# Patient Record
Sex: Female | Born: 1940 | Race: White | Hispanic: No | Smoking: Never smoker
Health system: Southern US, Community
[De-identification: ages and names within clinical notes are randomized; demographics above are authoritative.]

## PROBLEM LIST (undated history)

## (undated) DIAGNOSIS — M810 Age-related osteoporosis without current pathological fracture: Secondary | ICD-10-CM

## (undated) DIAGNOSIS — C50919 Malignant neoplasm of unspecified site of unspecified female breast: Secondary | ICD-10-CM

## (undated) DIAGNOSIS — D496 Neoplasm of unspecified behavior of brain: Secondary | ICD-10-CM

## (undated) DIAGNOSIS — Z9221 Personal history of antineoplastic chemotherapy: Secondary | ICD-10-CM

## (undated) DIAGNOSIS — C50911 Malignant neoplasm of unspecified site of right female breast: Secondary | ICD-10-CM

## (undated) DIAGNOSIS — C44310 Basal cell carcinoma of skin of unspecified parts of face: Secondary | ICD-10-CM

## (undated) DIAGNOSIS — G40909 Epilepsy, unspecified, not intractable, without status epilepticus: Secondary | ICD-10-CM

## (undated) DIAGNOSIS — Z9289 Personal history of other medical treatment: Secondary | ICD-10-CM

## (undated) HISTORY — DX: Age-related osteoporosis without current pathological fracture: M81.0

## (undated) HISTORY — DX: Malignant neoplasm of unspecified site of right female breast: C50.911

## (undated) HISTORY — PX: PARTIAL HYSTERECTOMY: SHX80

## (undated) HISTORY — DX: Epilepsy, unspecified, not intractable, without status epilepticus: G40.909

## (undated) HISTORY — PX: MASTECTOMY: SHX3

## (undated) HISTORY — DX: Personal history of antineoplastic chemotherapy: Z92.21

## (undated) HISTORY — DX: Basal cell carcinoma of skin of unspecified parts of face: C44.310

## (undated) HISTORY — DX: Neoplasm of unspecified behavior of brain: D49.6

## (undated) HISTORY — DX: Personal history of other medical treatment: Z92.89

---

## 2001-08-27 ENCOUNTER — Encounter: Payer: Self-pay | Admitting: Neurosurgery

## 2001-08-27 ENCOUNTER — Ambulatory Visit (HOSPITAL_COMMUNITY): Admission: RE | Admit: 2001-08-27 | Discharge: 2001-08-27 | Payer: Self-pay | Admitting: Neurosurgery

## 2004-10-14 ENCOUNTER — Ambulatory Visit: Payer: Self-pay | Admitting: Internal Medicine

## 2004-10-22 ENCOUNTER — Ambulatory Visit: Payer: Self-pay | Admitting: Family Medicine

## 2004-10-24 DIAGNOSIS — D496 Neoplasm of unspecified behavior of brain: Secondary | ICD-10-CM

## 2004-10-24 HISTORY — DX: Neoplasm of unspecified behavior of brain: D49.6

## 2004-10-24 HISTORY — PX: OTHER SURGICAL HISTORY: SHX169

## 2005-03-22 ENCOUNTER — Ambulatory Visit: Payer: Self-pay | Admitting: Internal Medicine

## 2005-09-06 ENCOUNTER — Ambulatory Visit: Payer: Self-pay | Admitting: Internal Medicine

## 2005-10-22 ENCOUNTER — Emergency Department: Payer: Self-pay | Admitting: Emergency Medicine

## 2006-02-07 ENCOUNTER — Ambulatory Visit: Payer: Self-pay | Admitting: Family

## 2006-08-15 ENCOUNTER — Ambulatory Visit: Payer: Self-pay | Admitting: Internal Medicine

## 2006-08-17 ENCOUNTER — Ambulatory Visit: Payer: Self-pay | Admitting: Internal Medicine

## 2006-10-02 ENCOUNTER — Ambulatory Visit: Payer: Self-pay | Admitting: Internal Medicine

## 2007-10-09 ENCOUNTER — Ambulatory Visit: Payer: Self-pay | Admitting: Internal Medicine

## 2008-10-24 DIAGNOSIS — C50919 Malignant neoplasm of unspecified site of unspecified female breast: Secondary | ICD-10-CM

## 2008-10-24 HISTORY — DX: Malignant neoplasm of unspecified site of unspecified female breast: C50.919

## 2008-10-30 ENCOUNTER — Ambulatory Visit: Payer: Self-pay | Admitting: Internal Medicine

## 2008-12-30 ENCOUNTER — Ambulatory Visit: Payer: Self-pay | Admitting: Surgery

## 2009-01-05 ENCOUNTER — Ambulatory Visit: Payer: Self-pay | Admitting: Surgery

## 2009-01-05 DIAGNOSIS — C50911 Malignant neoplasm of unspecified site of right female breast: Secondary | ICD-10-CM

## 2009-01-05 HISTORY — DX: Malignant neoplasm of unspecified site of right female breast: C50.911

## 2009-01-30 ENCOUNTER — Inpatient Hospital Stay: Payer: Self-pay | Admitting: Surgery

## 2009-01-30 HISTORY — PX: MASTECTOMY WITH AXILLARY LYMPH NODE DISSECTION: SHX5661

## 2009-02-21 ENCOUNTER — Ambulatory Visit: Payer: Self-pay | Admitting: Internal Medicine

## 2009-03-02 ENCOUNTER — Ambulatory Visit: Payer: Self-pay | Admitting: Internal Medicine

## 2009-03-09 ENCOUNTER — Ambulatory Visit: Payer: Self-pay | Admitting: Surgery

## 2009-03-24 ENCOUNTER — Ambulatory Visit: Payer: Self-pay | Admitting: Internal Medicine

## 2009-04-23 ENCOUNTER — Ambulatory Visit: Payer: Self-pay | Admitting: Internal Medicine

## 2009-05-24 ENCOUNTER — Ambulatory Visit: Payer: Self-pay | Admitting: Internal Medicine

## 2009-06-24 ENCOUNTER — Ambulatory Visit: Payer: Self-pay | Admitting: Internal Medicine

## 2009-07-24 ENCOUNTER — Ambulatory Visit: Payer: Self-pay | Admitting: Internal Medicine

## 2009-08-24 ENCOUNTER — Ambulatory Visit: Payer: Self-pay | Admitting: Internal Medicine

## 2009-09-02 ENCOUNTER — Ambulatory Visit: Payer: Self-pay | Admitting: Internal Medicine

## 2009-09-15 ENCOUNTER — Ambulatory Visit: Payer: Self-pay | Admitting: Internal Medicine

## 2009-09-23 ENCOUNTER — Ambulatory Visit: Payer: Self-pay | Admitting: Internal Medicine

## 2009-10-24 ENCOUNTER — Ambulatory Visit: Payer: Self-pay | Admitting: Internal Medicine

## 2009-11-24 ENCOUNTER — Ambulatory Visit: Payer: Self-pay | Admitting: Internal Medicine

## 2009-12-22 ENCOUNTER — Ambulatory Visit: Payer: Self-pay | Admitting: Internal Medicine

## 2009-12-23 ENCOUNTER — Ambulatory Visit: Payer: Self-pay | Admitting: Internal Medicine

## 2010-01-22 ENCOUNTER — Ambulatory Visit: Payer: Self-pay | Admitting: Internal Medicine

## 2010-02-21 ENCOUNTER — Ambulatory Visit: Payer: Self-pay | Admitting: Internal Medicine

## 2010-03-24 ENCOUNTER — Ambulatory Visit: Payer: Self-pay | Admitting: Internal Medicine

## 2010-04-28 ENCOUNTER — Ambulatory Visit: Payer: Self-pay | Admitting: Internal Medicine

## 2010-04-29 LAB — CANCER ANTIGEN 27.29: CA 27.29: 9.8 U/mL (ref 0.0–38.6)

## 2010-05-24 ENCOUNTER — Ambulatory Visit: Payer: Self-pay | Admitting: Internal Medicine

## 2010-08-30 ENCOUNTER — Ambulatory Visit: Payer: Self-pay | Admitting: Internal Medicine

## 2010-09-01 LAB — CANCER ANTIGEN 27.29: CA 27.29: 12.1 U/mL (ref 0.0–38.6)

## 2010-09-23 ENCOUNTER — Ambulatory Visit: Payer: Self-pay | Admitting: Internal Medicine

## 2011-01-20 ENCOUNTER — Ambulatory Visit: Payer: Self-pay | Admitting: Internal Medicine

## 2011-01-31 ENCOUNTER — Ambulatory Visit: Payer: Self-pay | Admitting: Internal Medicine

## 2011-02-01 LAB — CANCER ANTIGEN 27.29: CA 27.29: 14.4 U/mL (ref 0.0–38.6)

## 2011-02-22 ENCOUNTER — Ambulatory Visit: Payer: Self-pay | Admitting: Internal Medicine

## 2011-02-22 DIAGNOSIS — C44310 Basal cell carcinoma of skin of unspecified parts of face: Secondary | ICD-10-CM

## 2011-02-22 HISTORY — DX: Basal cell carcinoma of skin of unspecified parts of face: C44.310

## 2011-08-02 ENCOUNTER — Ambulatory Visit: Payer: Self-pay | Admitting: Internal Medicine

## 2011-08-03 LAB — CANCER ANTIGEN 27.29: CA 27.29: 14.3 U/mL (ref 0.0–38.6)

## 2011-08-25 ENCOUNTER — Ambulatory Visit: Payer: Self-pay | Admitting: Internal Medicine

## 2012-01-26 ENCOUNTER — Ambulatory Visit: Payer: Self-pay | Admitting: Internal Medicine

## 2012-01-31 ENCOUNTER — Ambulatory Visit: Payer: Self-pay | Admitting: Internal Medicine

## 2012-01-31 LAB — CBC CANCER CENTER
Basophil %: 0.5 %
Eosinophil #: 0.3 x10 3/mm (ref 0.0–0.7)
HGB: 14.2 g/dL (ref 12.0–16.0)
Lymphocyte #: 1.8 x10 3/mm (ref 1.0–3.6)
Lymphocyte %: 28.4 %
MCH: 32.9 pg (ref 26.0–34.0)
MCHC: 34.1 g/dL (ref 32.0–36.0)
MCV: 97 fL (ref 80–100)
Monocyte #: 0.6 x10 3/mm (ref 0.0–0.7)
Monocyte %: 9.5 %
Platelet: 239 x10 3/mm (ref 150–440)
RDW: 12.9 % (ref 11.5–14.5)

## 2012-01-31 LAB — HEPATIC FUNCTION PANEL A (ARMC)
Albumin: 4.3 g/dL (ref 3.4–5.0)
Alkaline Phosphatase: 103 U/L (ref 50–136)
Bilirubin, Direct: 0.1 mg/dL (ref 0.00–0.20)
Bilirubin,Total: 0.3 mg/dL (ref 0.2–1.0)

## 2012-01-31 LAB — CREATININE, SERUM: EGFR (African American): >60

## 2012-02-01 LAB — CANCER ANTIGEN 27.29: CA 27.29: 16.8 U/mL (ref 0.0–38.6)

## 2012-02-22 ENCOUNTER — Ambulatory Visit: Payer: Self-pay | Admitting: Internal Medicine

## 2012-07-31 ENCOUNTER — Ambulatory Visit: Payer: Self-pay | Admitting: Internal Medicine

## 2012-07-31 LAB — HEPATIC FUNCTION PANEL A (ARMC)
Albumin: 4.1 g/dL (ref 3.4–5.0)
Alkaline Phosphatase: 96 U/L (ref 50–136)
Bilirubin, Direct: 0 mg/dL (ref 0.00–0.20)
Bilirubin,Total: 0.3 mg/dL (ref 0.2–1.0)
SGOT(AST): 23 U/L (ref 15–37)
SGPT (ALT): 35 U/L (ref 12–78)
Total Protein: 7.2 g/dL (ref 6.4–8.2)

## 2012-07-31 LAB — CBC CANCER CENTER
Basophil #: 0 x10 3/mm (ref 0.0–0.1)
Basophil %: 0.7 %
Eosinophil #: 0.4 x10 3/mm (ref 0.0–0.7)
Eosinophil %: 6.8 %
HCT: 43.4 % (ref 35.0–47.0)
HGB: 14.2 g/dL (ref 12.0–16.0)
Lymphocyte #: 1.6 x10 3/mm (ref 1.0–3.6)
Lymphocyte %: 27.2 %
MCH: 31.8 pg (ref 26.0–34.0)
MCHC: 32.7 g/dL (ref 32.0–36.0)
MCV: 97 fL (ref 80–100)
Monocyte #: 0.6 x10 3/mm (ref 0.2–0.9)
Monocyte %: 10 %
Neutrophil #: 3.2 x10 3/mm (ref 1.4–6.5)
Neutrophil %: 55.3 %
Platelet: 226 x10 3/mm (ref 150–440)
RBC: 4.47 10*6/uL (ref 3.80–5.20)
RDW: 12.6 % (ref 11.5–14.5)
WBC: 5.8 x10 3/mm (ref 3.6–11.0)

## 2012-07-31 LAB — CREATININE, SERUM
Creatinine: 1.01 mg/dL (ref 0.60–1.30)
EGFR (African American): >60
EGFR (Non-African Amer.): 55 — ABNORMAL LOW

## 2012-08-01 LAB — CANCER ANTIGEN 27.29: CA 27.29: 11.2 U/mL (ref 0.0–38.6)

## 2012-08-24 ENCOUNTER — Ambulatory Visit: Payer: Self-pay | Admitting: Internal Medicine

## 2012-09-23 DIAGNOSIS — M810 Age-related osteoporosis without current pathological fracture: Secondary | ICD-10-CM

## 2012-09-23 HISTORY — DX: Age-related osteoporosis without current pathological fracture: M81.0

## 2013-01-28 ENCOUNTER — Ambulatory Visit: Payer: Self-pay | Admitting: Internal Medicine

## 2013-08-02 ENCOUNTER — Ambulatory Visit: Payer: Self-pay | Admitting: Internal Medicine

## 2013-08-02 LAB — CBC CANCER CENTER
Basophil #: 0.1 x10 3/mm (ref 0.0–0.1)
Basophil %: 0.7 %
Lymphocyte #: 2.6 x10 3/mm (ref 1.0–3.6)
Lymphocyte %: 33.4 %
MCHC: 34 g/dL (ref 32.0–36.0)
Monocyte #: 0.9 x10 3/mm (ref 0.2–0.9)
Monocyte %: 12 %
Neutrophil #: 3.5 x10 3/mm (ref 1.4–6.5)
Neutrophil %: 45.1 %
RDW: 12.7 % (ref 11.5–14.5)
WBC: 7.7 x10 3/mm (ref 3.6–11.0)

## 2013-08-02 LAB — HEPATIC FUNCTION PANEL A (ARMC)
Albumin: 4 g/dL (ref 3.4–5.0)
Bilirubin, Direct: 0.1 mg/dL (ref 0.00–0.20)
SGOT(AST): 18 U/L (ref 15–37)
SGPT (ALT): 36 U/L (ref 12–78)
Total Protein: 7.2 g/dL (ref 6.4–8.2)

## 2013-08-02 LAB — CREATININE, SERUM: EGFR (Non-African Amer.): 48 — ABNORMAL LOW

## 2013-08-03 LAB — CANCER ANTIGEN 27.29: CA 27.29: 9.8 U/mL (ref 0.0–38.6)

## 2013-08-24 ENCOUNTER — Ambulatory Visit: Payer: Self-pay | Admitting: Internal Medicine

## 2014-01-29 ENCOUNTER — Ambulatory Visit: Payer: Self-pay | Admitting: Internal Medicine

## 2014-04-07 ENCOUNTER — Other Ambulatory Visit: Payer: Self-pay | Admitting: Internal Medicine

## 2014-04-07 DIAGNOSIS — G40309 Generalized idiopathic epilepsy and epileptic syndromes, not intractable, without status epilepticus: Secondary | ICD-10-CM | POA: Insufficient documentation

## 2014-04-07 DIAGNOSIS — M810 Age-related osteoporosis without current pathological fracture: Secondary | ICD-10-CM | POA: Insufficient documentation

## 2014-04-07 LAB — PHENYTOIN LEVEL, TOTAL: DILANTIN: 18.2 ug/mL (ref 10.0–20.0)

## 2014-08-11 ENCOUNTER — Ambulatory Visit: Payer: Self-pay | Admitting: Internal Medicine

## 2014-08-11 LAB — CBC CANCER CENTER
Basophil #: 0.1 x10 3/mm (ref 0.0–0.1)
Basophil %: 0.8 %
EOS ABS: 0.6 x10 3/mm (ref 0.0–0.7)
EOS PCT: 8.9 %
HCT: 45 % (ref 35.0–47.0)
HGB: 14.8 g/dL (ref 12.0–16.0)
Lymphocyte #: 1.6 x10 3/mm (ref 1.0–3.6)
Lymphocyte %: 24 %
MCH: 32 pg (ref 26.0–34.0)
MCHC: 32.8 g/dL (ref 32.0–36.0)
MCV: 97 fL (ref 80–100)
Monocyte #: 0.7 x10 3/mm (ref 0.2–0.9)
Monocyte %: 10.4 %
Neutrophil #: 3.8 x10 3/mm (ref 1.4–6.5)
Neutrophil %: 55.9 %
PLATELETS: 261 x10 3/mm (ref 150–440)
RBC: 4.62 10*6/uL (ref 3.80–5.20)
RDW: 13 % (ref 11.5–14.5)
WBC: 6.8 x10 3/mm (ref 3.6–11.0)

## 2014-08-11 LAB — CREATININE, SERUM
CREATININE: 1.03 mg/dL (ref 0.60–1.30)
EGFR (Non-African Amer.): 56 — ABNORMAL LOW

## 2014-08-11 LAB — HEPATIC FUNCTION PANEL A (ARMC)
ALT: 38 U/L
Albumin: 4.2 g/dL (ref 3.4–5.0)
Alkaline Phosphatase: 78 U/L
Bilirubin, Direct: 0.1 mg/dL (ref 0.0–0.2)
Bilirubin,Total: 0.3 mg/dL (ref 0.2–1.0)
SGOT(AST): 22 U/L (ref 15–37)
Total Protein: 7.4 g/dL (ref 6.4–8.2)

## 2014-08-12 LAB — CANCER ANTIGEN 27.29: CA 27.29: 10.5 U/mL (ref 0.0–38.6)

## 2014-08-24 ENCOUNTER — Ambulatory Visit: Payer: Self-pay | Admitting: Internal Medicine

## 2015-02-02 ENCOUNTER — Ambulatory Visit
Admit: 2015-02-02 | Disposition: A | Discharge status: Home / Self Care | Payer: Self-pay | Attending: Internal Medicine | Admitting: Internal Medicine

## 2015-02-02 DIAGNOSIS — Z9289 Personal history of other medical treatment: Secondary | ICD-10-CM

## 2015-02-02 HISTORY — DX: Personal history of other medical treatment: Z92.89

## 2015-02-20 ENCOUNTER — Inpatient Hospital Stay
Admission: EM | Admit: 2015-02-20 | Discharge: 2015-02-22 | DRG: 872 | Disposition: A | Discharge status: Skilled Nursing Facility | Payer: Medicare Other | Attending: Internal Medicine | Admitting: Internal Medicine

## 2015-02-20 DIAGNOSIS — Z87891 Personal history of nicotine dependence: Secondary | ICD-10-CM | POA: Diagnosis not present

## 2015-02-20 DIAGNOSIS — R748 Abnormal levels of other serum enzymes: Secondary | ICD-10-CM | POA: Diagnosis present

## 2015-02-20 DIAGNOSIS — N179 Acute kidney failure, unspecified: Secondary | ICD-10-CM | POA: Diagnosis present

## 2015-02-20 DIAGNOSIS — L89152 Pressure ulcer of sacral region, stage 2: Secondary | ICD-10-CM | POA: Diagnosis present

## 2015-02-20 DIAGNOSIS — Z79899 Other long term (current) drug therapy: Secondary | ICD-10-CM

## 2015-02-20 DIAGNOSIS — N390 Urinary tract infection, site not specified: Secondary | ICD-10-CM | POA: Diagnosis present

## 2015-02-20 DIAGNOSIS — A419 Sepsis, unspecified organism: Principal | ICD-10-CM | POA: Diagnosis present

## 2015-02-20 DIAGNOSIS — E860 Dehydration: Secondary | ICD-10-CM | POA: Diagnosis present

## 2015-02-20 DIAGNOSIS — M6282 Rhabdomyolysis: Secondary | ICD-10-CM | POA: Diagnosis present

## 2015-02-20 DIAGNOSIS — Z7982 Long term (current) use of aspirin: Secondary | ICD-10-CM | POA: Diagnosis not present

## 2015-02-20 DIAGNOSIS — B9620 Unspecified Escherichia coli [E. coli] as the cause of diseases classified elsewhere: Secondary | ICD-10-CM | POA: Diagnosis present

## 2015-02-20 DIAGNOSIS — L89151 Pressure ulcer of sacral region, stage 1: Secondary | ICD-10-CM

## 2015-02-20 DIAGNOSIS — I248 Other forms of acute ischemic heart disease: Secondary | ICD-10-CM | POA: Diagnosis present

## 2015-02-20 DIAGNOSIS — G40909 Epilepsy, unspecified, not intractable, without status epilepticus: Secondary | ICD-10-CM | POA: Diagnosis present

## 2015-02-20 DIAGNOSIS — R7881 Bacteremia: Secondary | ICD-10-CM

## 2015-02-20 LAB — CBC
HCT: 41.8 % (ref 35.0–47.0)
HGB: 14.3 g/dL (ref 12.0–16.0)
MCH: 32.3 pg (ref 26.0–34.0)
MCHC: 34.3 g/dL (ref 32.0–36.0)
MCV: 94 fL (ref 80–100)
Platelet: 204 10*3/uL (ref 150–440)
RBC: 4.43 10*6/uL (ref 3.80–5.20)
RDW: 12.6 % (ref 11.5–14.5)
WBC: 20.3 10*3/uL — ABNORMAL HIGH (ref 3.6–11.0)

## 2015-02-20 LAB — COMPREHENSIVE METABOLIC PANEL
ALK PHOS: 100 U/L
ALT: 114 U/L — AB
Albumin: 2.9 g/dL — ABNORMAL LOW
Anion Gap: 11 (ref 7–16)
BILIRUBIN TOTAL: 2.4 mg/dL — AB
BUN: 63 mg/dL — ABNORMAL HIGH
CALCIUM: 8.3 mg/dL — AB
CREATININE: 2.79 mg/dL — AB
Chloride: 101 mmol/L
Co2: 24 mmol/L
EGFR (African American): 18 — ABNORMAL LOW
EGFR (Non-African Amer.): 16 — ABNORMAL LOW
GLUCOSE: 161 mg/dL — AB
Potassium: 3.4 mmol/L — ABNORMAL LOW
SGOT(AST): 122 U/L — ABNORMAL HIGH
SODIUM: 136 mmol/L
Total Protein: 6.7 g/dL

## 2015-02-20 LAB — URINALYSIS, COMPLETE
Bilirubin,UR: NEGATIVE
Glucose,UR: NEGATIVE mg/dL (ref 0–75)
Ketone: NEGATIVE
Nitrite: NEGATIVE
Ph: 5 (ref 4.5–8.0)
Specific Gravity: 1.01 (ref 1.003–1.030)

## 2015-02-20 LAB — LACTIC ACID, PLASMA: Lactic Acid, Venous: 1.8 mmol/L

## 2015-02-20 LAB — TROPONIN I: TROPONIN-I: 0.26 ng/mL — AB

## 2015-02-20 LAB — CK: CK, Total: 1559 U/L — ABNORMAL HIGH

## 2015-02-21 DIAGNOSIS — A419 Sepsis, unspecified organism: Secondary | ICD-10-CM | POA: Diagnosis present

## 2015-02-21 LAB — CBC WITH DIFFERENTIAL/PLATELET
BASOS ABS: 0.1 10*3/uL (ref 0.0–0.1)
Basophil %: 0.3 %
EOS PCT: 0.9 %
Eosinophil #: 0.1 10*3/uL (ref 0.0–0.7)
HCT: 38.7 % (ref 35.0–47.0)
HGB: 13.2 g/dL (ref 12.0–16.0)
Lymphocyte #: 0.9 10*3/uL — ABNORMAL LOW (ref 1.0–3.6)
Lymphocyte %: 5.3 %
MCH: 32.4 pg (ref 26.0–34.0)
MCHC: 34.2 g/dL (ref 32.0–36.0)
MCV: 95 fL (ref 80–100)
MONO ABS: 1.5 x10 3/mm — AB (ref 0.2–0.9)
MONOS PCT: 8.9 %
Neutrophil #: 14.3 10*3/uL — ABNORMAL HIGH (ref 1.4–6.5)
Neutrophil %: 84.6 %
PLATELETS: 190 10*3/uL (ref 150–440)
RBC: 4.08 10*6/uL (ref 3.80–5.20)
RDW: 12.4 % (ref 11.5–14.5)
WBC: 16.9 10*3/uL — ABNORMAL HIGH (ref 3.6–11.0)

## 2015-02-21 LAB — COMPREHENSIVE METABOLIC PANEL
ANION GAP: 8 (ref 7–16)
AST: 121 U/L — AB
Albumin: 2.3 g/dL — ABNORMAL LOW
Alkaline Phosphatase: 102 U/L
BUN: 56 mg/dL — ABNORMAL HIGH
Bilirubin,Total: 2.1 mg/dL — ABNORMAL HIGH
CALCIUM: 7.1 mg/dL — AB
Chloride: 110 mmol/L
Co2: 19 mmol/L — ABNORMAL LOW
Creatinine: 2.56 mg/dL — ABNORMAL HIGH
EGFR (Non-African Amer.): 18 — ABNORMAL LOW
GFR CALC AF AMER: 20 — AB
Glucose: 122 mg/dL — ABNORMAL HIGH
Potassium: 2.8 mmol/L — ABNORMAL LOW
SGPT (ALT): 103 U/L — ABNORMAL HIGH
Sodium: 137 mmol/L
TOTAL PROTEIN: 5.5 g/dL — AB

## 2015-02-21 LAB — CK: CK, TOTAL: 786 U/L — AB

## 2015-02-21 MED ORDER — SODIUM CHLORIDE 0.9 % IJ SOLN: 3.0000 mL | INTRAMUSCULAR | Status: DC | PRN

## 2015-02-21 MED ORDER — SODIUM CHLORIDE 0.9 % IV SOLN
INTRAVENOUS | Status: DC
  Administered 2015-02-22: 06:00:00 via INTRAVENOUS

## 2015-02-21 MED ORDER — PNEUMOCOCCAL VAC POLYVALENT 25 MCG/0.5ML IJ INJ: 0.5000 mL | INJECTION | INTRAMUSCULAR | Status: DC | PRN

## 2015-02-21 MED ORDER — ONDANSETRON HCL 4 MG/2ML IJ SOLN: 4.0000 mg | INTRAMUSCULAR | Status: DC | PRN

## 2015-02-21 MED ORDER — PHENYTOIN SODIUM EXTENDED 100 MG PO CAPS
100.0000 mg | ORAL_CAPSULE | Freq: Three times a day (TID) | ORAL | Status: DC
  Administered 2015-02-22: 100 mg via ORAL
  Filled 2015-02-21: qty 1

## 2015-02-21 MED ORDER — ACETAMINOPHEN 325 MG PO TABS: 650.0000 mg | ORAL_TABLET | ORAL | Status: DC | PRN

## 2015-02-21 MED ORDER — HEPARIN SODIUM (PORCINE) 5000 UNIT/ML IJ SOLN
5000.0000 [IU] | Freq: Three times a day (TID) | INTRAMUSCULAR | Status: DC
  Administered 2015-02-22 (×2): 5000 [IU] via SUBCUTANEOUS
  Filled 2015-02-21 (×2): qty 1

## 2015-02-21 MED ORDER — CEFTRIAXONE SODIUM IN DEXTROSE 20 MG/ML IV SOLN
1.0000 g | INTRAVENOUS | Status: DC
  Filled 2015-02-21: qty 50

## 2015-02-21 MED ORDER — VITAMIN B-12 1000 MCG PO TABS
500.0000 ug | ORAL_TABLET | Freq: Every day | ORAL | Status: DC
  Administered 2015-02-22: 500 ug via ORAL

## 2015-02-21 MED ORDER — POTASSIUM CHLORIDE CRYS ER 10 MEQ PO TBCR
10.0000 meq | EXTENDED_RELEASE_TABLET | Freq: Two times a day (BID) | ORAL | Status: DC
  Administered 2015-02-22: 10 meq via ORAL
  Filled 2015-02-21: qty 1

## 2015-02-22 DIAGNOSIS — R7881 Bacteremia: Secondary | ICD-10-CM

## 2015-02-22 DIAGNOSIS — L89151 Pressure ulcer of sacral region, stage 1: Secondary | ICD-10-CM

## 2015-02-22 LAB — COMPREHENSIVE METABOLIC PANEL
ALT: 94 U/L — ABNORMAL HIGH (ref 14–54)
ANION GAP: 6 (ref 5–15)
AST: 98 U/L — AB (ref 15–41)
Albumin: 2 g/dL — ABNORMAL LOW (ref 3.5–5.0)
Alkaline Phosphatase: 98 U/L (ref 38–126)
BUN: 44 mg/dL — ABNORMAL HIGH (ref 6–20)
CALCIUM: 6.9 mg/dL — AB (ref 8.9–10.3)
CHLORIDE: 113 mmol/L — AB (ref 101–111)
CO2: 19 mmol/L — ABNORMAL LOW (ref 22–32)
Creatinine, Ser: 2.31 mg/dL — ABNORMAL HIGH (ref 0.44–1.00)
GFR, EST AFRICAN AMERICAN: 22 mL/min — AB (ref >60)
GFR, EST NON AFRICAN AMERICAN: 19 mL/min — AB (ref >60)
GLUCOSE: 116 mg/dL — AB (ref 65–99)
Potassium: 3.6 mmol/L (ref 3.5–5.1)
Sodium: 138 mmol/L (ref 135–145)
Total Bilirubin: 1.9 mg/dL — ABNORMAL HIGH (ref 0.3–1.2)
Total Protein: 5 g/dL — ABNORMAL LOW (ref 6.5–8.1)

## 2015-02-22 LAB — CK TOTAL AND CKMB (NOT AT ARMC)
CK TOTAL: 460 U/L — AB (ref 38–234)
CK, MB: 4.4 ng/mL (ref 0.5–5.0)
Relative Index: 1 (ref 0.0–2.5)

## 2015-02-22 LAB — CBC
HCT: 35 % (ref 35.0–47.0)
Hemoglobin: 11.8 g/dL — ABNORMAL LOW (ref 12.0–16.0)
MCH: 32 pg (ref 26.0–34.0)
MCHC: 33.6 g/dL (ref 32.0–36.0)
MCV: 95.3 fL (ref 80.0–100.0)
PLATELETS: 199 10*3/uL (ref 150–440)
RBC: 3.68 MIL/uL — AB (ref 3.80–5.20)
RDW: 12.9 % (ref 11.5–14.5)
WBC: 15.3 10*3/uL — AB (ref 3.6–11.0)

## 2015-02-22 LAB — CULTURE, BLOOD (SINGLE)

## 2015-02-22 MED ORDER — AMOXICILLIN-POT CLAVULANATE 875-125 MG PO TABS: 1.0000 | ORAL_TABLET | Freq: Two times a day (BID) | ORAL | Status: DC

## 2015-02-22 NOTE — Discharge Instructions (Signed)
Please follow up with your PCP in one week. Repeat CBC and BMP in one week at that time Check Sacral Decub daily and keep dressing dry. Apply sacral foam dressing.  Change every 3 days and as needed.

## 2015-02-22 NOTE — Discharge Summary (Signed)
[name], is a 74 y.o. female  DOB [birth year]  MRN [id number].  Admission date:  02/20/2015  Admitting Physician  Henreitta Leber, MD  Discharge Date:  02/22/2015   Primary MD  No primary care provider on file.    Admission Diagnosis  SEPSIS UTI   Discharge Diagnosis  SEPSIS UTI  Active Problems:   Sepsis   E coli bacteremia   Decubitus ulcer of sacral region, stage 1      No past medical history on file.  No past surgical history on file.     History of present illness and  Hospital Course:     Kindly see H&P for history of present illness and admission details, please review complete Labs, Consult reports and Test reports for all details in brief  HPI  from the history and physical done on the day of admission    Hospital Course   1. E. Coli bacteremia: patient was placed on Rocephin as originally on admission she presented with UTI. Her blood cultures were positive for E.col. She was afebrile and tolerated Rocephin. She was changed to oral Augment at discharge.Her bacteremia is from her UTI.  2. Sacral decubitus stage 1: She need to continue with wound care as written on discharge instructions. 3. Hx of seizure disorder: She will cont with dilantin.    Discharge Condition: stable   Follow UP  Follow-up Information    Follow up with your PCP. Schedule an appointment as soon as possible for a visit in 1 week.        Discharge Instructions  and  Discharge Medications     Discharge Instructions    Diet - low sodium heart healthy    Complete by:  As directed      Discharge instructions    Complete by:  As directed   Follow up with PCP and CBC and BMP in 1 week     Increase activity slowly    Complete by:  As directed             Medication List    TAKE these medications         amoxicillin-clavulanate 875-125 MG per tablet  Commonly known as:  AUGMENTIN  Take 1 tablet by mouth 2 (two) times daily.     aspirin 81 MG tablet  Take 81 mg by mouth daily. Aspirin 81 mg oral     phenytoin 100 MG ER capsule  Commonly known as:  DILANTIN  Take 100 mg by mouth 3 (three) times daily.     VITAMIN B-12 PO  Take by mouth daily.          Diet and Activity recommendation: See Discharge Instructions above   Consults obtained -none    Micro Results     Recent Results (from the past 240 hour(s))  Urine culture     Status: None (Preliminary result)   Collection Time: 02/20/15  4:57 PM  Result Value Ref Range Status   Micro Text Report  Preliminary       SOURCE: CLEAN CATCH    ORGANISM 1                >100,000 CFU/ML ESCHERICHIA COLI   ORGANISM 2                40,000 CFU GRAM POSITIVE COCCI   COMMENT                   ID TO FOLLOW SENSITIVITIES TO FOLLOW   COMMENT                   ONCE ISOLATED ORG#2   ANTIBIOTIC                    ORG#1     AMPICILLIN                    S         CEFAZOLIN                     S         CEFOXITIN                     S         CEFTRIAXONE                   S         CIPROFLOXACIN                 S         GENTAMICIN                    S         IMIPENEM                      S         LEVOFLOXACIN                  S         NITROFURANTOIN                S         TRIMETHOPRIM/SULFAMETHOXAZOLE S           Culture, blood (single)     Status: None (Preliminary result)   Collection Time: 02/20/15  4:58 PM  Result Value Ref Range Status   Micro Text Report   Preliminary       ORGANISM 1                GRAM NEGATIVE ROD   COMMENT                   IN AEROBIC AND ANAEROBIC BOTTLES   COMMENT                   ID TO FOLLOW SENSITIVITIES TO FOLLOW   GRAM STAIN                GRAM NEGATIVE ROD   GRAM STAIN                GRAM NEGATIVE ROD   ANTIBIOTIC  Culture, blood (single)     Status: None (Preliminary result)   Collection Time: 02/20/15  6:37 PM  Result Value Ref Range Status   Micro Text Report   Preliminary       COMMENT                   NO GROWTH IN 36 HOURS   ANTIBIOTIC                                                           Today   Subjective:   Gloriajean Okun today has no headache,no chest abdominal pain,no new weakness tingling or numbness, feels much better.   Objective:   Blood pressure 106/56, pulse 93, temperature 99 F (37.2 C), temperature source Oral, resp. rate 18, height 5\' 5"  (1.651 m), weight 56.246 kg (124 lb), SpO2 99 %.   Intake/Output Summary (Last 24 hours) at 02/22/15 1252 Last data filed at 02/22/15 1031  Gross per 24 hour  Intake    360 ml  Output    300 ml  Net     60 ml    Exam Awake Alert, Oriented x 3, No new F.N deficits, Normal affect [place].AT,PERRAL Supple Neck,No JVD, No cervical lymphadenopathy appriciated.  Symmetrical Chest wall movement, Good air movement bilaterally, CTAB RRR,No Gallops,Rubs or new Murmurs,  +ve B.Sounds, Abd Soft, Non tender, No organomegaly appriciated, No rebound -guarding or rigidity. No Cyanosis, Clubbing or edema, No new Rash or bruise  Data Review   CBC w Diff: Lab Results  Component Value Date   WBC 15.3* 02/22/2015   WBC 16.9* 02/21/2015   HGB 11.8* 02/22/2015   HGB 13.2 02/21/2015   HCT 35.0 02/22/2015   HCT 38.7 02/21/2015   PLT 199 02/22/2015   PLT 190 02/21/2015   LYMPHOPCT 5.3 02/21/2015   MONOPCT 8.9 02/21/2015   EOSPCT 0.9 02/21/2015   BASOPCT 0.3 02/21/2015    CMP: Lab Results  Component Value Date   NA 138 02/22/2015   K 3.6 02/22/2015   CL 113* 02/22/2015   CO2 19* 02/22/2015   CO2 19* 02/21/2015   BUN 44* 02/22/2015   BUN 56* 02/21/2015   CREATININE 2.31* 02/22/2015   CREATININE 2.56* 02/21/2015   PROT 5.0* 02/22/2015   PROT 5.5* 02/21/2015   ALBUMIN 2.0* 02/22/2015   ALBUMIN 2.3* 02/21/2015   BILITOT 1.9*  02/22/2015   ALKPHOS 98 02/22/2015   ALKPHOS 102 02/21/2015   AST 98* 02/22/2015   AST 121* 02/21/2015   ALT 94* 02/22/2015   ALT 103* 02/21/2015  .   Total Time in preparing paper work, data evaluation and todays exam - 35 minutes

## 2015-02-22 NOTE — Progress Notes (Signed)
Patient is alert with some confusion. No signs and  symptoms of pain or any acute distress noted. ABT continues with no adverse reactions.  Patient remain NSR on the heart monitor.

## 2015-02-22 NOTE — Progress Notes (Signed)
CSW spoke with pt and pt's dtr, Claiborne Rigg [phone], re: PT recommendation for SNF. Per MD, pt stable for d/c to SNF today. FL2 complete and SNF search initiated. Will f/u with offers and assist with d/c to SNF today.  Wandra Feinstein, MSW, LCSW [phone] (weekend coverage)

## 2015-02-22 NOTE — Progress Notes (Signed)
Report called to [facility]. Patient discharge instructions and status gone over with RN. Iv and telemetry removed. Patient to be discharged on ra. No distress noted. Patients daughter to transport patient to facility. Will give daughter packet and instruct her to given packet to staff at [facility]

## 2015-02-22 NOTE — Clinical Social Work Placement (Addendum)
   CLINICAL SOCIAL WORK PLACEMENT  NOTE  Date:  02/22/2015  Patient Details  Name: [name] MRN: [id number] Date of Birth: 1941/07/23  Clinical Social Work is seeking post-discharge placement for this patient at the [place] level of care (*CSW will initial, date and re-position this form in  chart as items are completed):  Yes   Patient/family provided with [facility] Work Department's list of facilities offering this level of care within the geographic area requested by the patient (or if unable, by the patient's family).  Yes   Patient/family informed of their freedom to choose among providers that offer the needed level of care, that participate in Medicare, Medicaid or managed care program needed by the patient, have an available bed and are willing to accept the patient.  Yes   Patient/family informed of [facility]'s ownership interest in [facility] and [facility], as well as of the fact that they are under no obligation to receive care at these facilities.  PASRR submitted to EDS on 02/22/15     PASRR number received on  02/22/15     Existing PASRR number confirmed on   FL2 transmitted to all facilities in geographic area requested by pt/family on       FL2 transmitted to all facilities within larger geographic area on 02/22/15     Patient informed that his/her managed care company has contracts with or will negotiate with certain facilities, including the following:            Patient/family informed of bed offers received.  Patient chooses bed at       Physician recommends and patient chooses bed at      Patient to be transferred to   on  .  Patient to be transferred to facility by       Patient family notified on   of transfer.  Name of family member notified:        PHYSICIAN       Additional Comment:    _______________________________________________ Amador Cunas, LCSW 02/22/2015, 12:59 PM

## 2015-02-23 LAB — CULTURE, BLOOD (SINGLE)

## 2015-02-23 NOTE — H&P (Signed)
PATIENT NAME:  [name], [name] MR#:  [id number] DATE OF BIRTH:  1941-05-14  DATE OF ADMISSION:  02/20/2015   PRIMARY CARE PHYSICIAN:  Madelyn Brunner, MD   CHIEF COMPLAINT: Being found down and sepsis.   HISTORY OF PRESENT ILLNESS: This is a 74 year old female who presents to the Emergency Room due to being found down at her apartment. The patient herself is a poor historian; therefore, most of the history obtained from the chart. The patient apparently lives in an apartment independently, was found down on the floor, do not know how long she was down, maybe a few hours. As per the patient, patient says that she possibly had a syncopal event, although she denies any prodromal symptoms prior to that. She denies having a seizure recently. She does not appear to be in a postictal state. She was brought to the ER and noted to be tachycardic and noted to have a urinary tract infection and sepsis secondary to UTI. She was also noted to be in acute renal failure and severely dehydrated. Hospitalist services were contacted for further treatment and evaluation.   REVIEW OF SYSTEMS:  CONSTITUTIONAL: No documented fever. Positive generalized weakness. No weight gain, no weight loss.  EYES: No blurry or double vision.  EARS AND NOSE AND THROAT: No tinnitus. No postnasal drip. No redness of the oropharynx.  RESPIRATORY: No cough, no wheeze, no hemoptysis, or dyspnea.  CARDIOVASCULAR: No chest pain, no orthopnea, or palpitations, or syncope.  GASTROINTESTINAL: No nausea, vomiting, diarrhea, no abdominal pain; no melena, or hematochezia.  GENITOURINARY: No dysuria or hematuria.  ENDOCRINE: No polyuria or nocturia, no heat, or cold intolerance.  HEMATOLOGY: No anemia, no bruising, or bleeding.  INTEGUMENTARY: No rashes. No lesions.  MUSCULOSKELETAL: No arthritis, no swelling, no gout.  NEUROLOGIC: No numbness, tingling. No ataxia. No seizure-type activity.  PSYCHIATRIC: No anxiety, no insomnia, no ADD.    PAST MEDICAL HISTORY: Consistent with seizures, osteoporosis.   ALLERGIES: ARE TO VICODIN, BANANAS AND BAND-AIDS.   SOCIAL HISTORY: Used to be a smoker, quit many, many years ago; no alcohol abuse; no illicit drug abuse; lives by herself.   FAMILY HISTORY: She cannot recall what her mother and father died from.   CURRENT MEDICATIONS: As follows, aspirin 81 mg daily, Dilantin 100 mg t.i.d., and vitamin B12 supplements daily.   PHYSICAL EXAMINATION:  VITAL SIGNS: Presently is as follows, are noted to be, temperature 97.7, pulse 109, respirations 27, blood pressure 136/67, saturations 97% on room air.  GENERAL: The patient is a pleasant-appearing female, in no apparent distress.  HEAD AND EYES AND EARS AND NOSE AND THROAT: Atraumatic, normocephalic. Her extraocular muscles are intact; her pupils are reactive to light, sclerae anicteric, no conjunctival injection, dry oral mucosa.  NECK: Supple, there was no jugular venous distention, no bruits, no lymphadenopathy, or thyromegaly.  HEART: Regular rate and rhythm, tachycardic, no murmurs, no rubs, no clicks.  LUNGS: Clear to auscultation bilaterally, no rales or rhonchi, no wheezes.  ABDOMEN: Soft, flat, nontender, nondistended, has good bowel sounds, no hepatosplenomegaly appreciated.  EXTREMITIES: No evidence of any cyanosis, clubbing, or peripheral edema; has +2 pedal and radial pulses bilaterally.  NEUROLOGIC: The patient is alert, oriented x 3, with no focal motor or sensory deficits appreciated bilaterally, she is globally weak.  SKIN: Moist, warm with no rashes, stage I to II decubitus ulcer in the sacrum. LYMPHATIC: There is no cervical or axillary lymphadenopathy.    LABORATORY DATA: Serum glucose of 161, BUN 63,  creatinine 2.79, sodium 136, potassium 3.4, chloride 101, bicarbonate 24, lactate 1.8, AST 122, ALT 114, albumin is 2.9, total CK of 1559; troponin 0.26, white cell count of 20.3, hemoglobin 14.3, hematocrit 41.8, platelet  count 204,000; urinalysis showed 2+ leukocyte esterase, with too numerous to count white cells, and many bacteria.   ASSESSMENT AND PLAN: A 74 year old female with history of osteoporosis, seizures, history of a stage I to II decubitus ulcer who was found down in her apartment, noted to be in acute renal failure with sepsis from urinary tract infection.  Problem:  1.  Sepsis. This is likely secondary to urinary tract infection. The patient presented to the hospital with tachycardia, leukocytosis, and a positive urinalysis. I will treat the patient with IV fluids, IV ceftriaxone, follow urine cultures, and follow blood cultures.  2.  Urinary tract infection. This is likely the cause of the patient's sepsis. I will treat the patient on IV ceftriaxone, follow cultures as mentioned.  3.  Acute renal failure. This is likely secondary to dehydration and prerenal in nature. I will hydrate the patient with intravenous fluids, follow BUN and creatinine and urine output, renal dose medications, avoid nephrotoxins.  4.  Leukocytosis. I suspect this is secondary to the sepsis. I will follow the white cell count  after treatment with intravenous antibiotic therapy for the sepsis.  5.  Acute rhabdomyolysis. This is likely secondary to the fall and being down on the floor for a prolonged period of time. I will continue aggressive hydration with intravenous fluids, follow CKs.  6.  Stage I to II sacral decubitus. I will get a wound team consult, continue local wound care for now.  7.  History of seizures. Continue Dilantin. I will check a Dilantin level.  8.  Elevated troponin. This was likely in the setting of demand ischemia and from acute renal failure. For now, observe her on telemetry, follow serial cardiac markers.  9.  Abnormal liver function tests. I suspect this is probably related to the dehydration, sepsis and rhabdomyolysis. I will follow her liver function tests; if they are not improving, consider getting  an abdominal ultrasound.  10.  Status post fall. I will get a physical therapy consult and a social work consult. The patient likely may need placement to a short-term rehabilitation.   The patient is a full code.   Time spent on admission is 50 minutes.    ____________________________ Belia Heman. Verdell Carmine, MD vjs:nt D: 02/20/2015 19:16:38 ET T: 02/20/2015 19:35:21 ET JOB#: [id number]  cc: Belia Heman. Verdell Carmine, MD, <Dictator> Henreitta Leber MD ELECTRONICALLY SIGNED 02/20/2015 22:21

## 2015-02-24 LAB — URINE CULTURE

## 2015-08-11 ENCOUNTER — Encounter: Payer: Self-pay | Admitting: *Deleted

## 2015-08-11 ENCOUNTER — Other Ambulatory Visit: Payer: Self-pay | Admitting: *Deleted

## 2015-08-11 DIAGNOSIS — C50911 Malignant neoplasm of unspecified site of right female breast: Secondary | ICD-10-CM

## 2015-08-12 ENCOUNTER — Inpatient Hospital Stay: Payer: Medicare Other | Attending: Internal Medicine

## 2015-08-12 ENCOUNTER — Inpatient Hospital Stay (HOSPITAL_BASED_OUTPATIENT_CLINIC_OR_DEPARTMENT_OTHER): Payer: Medicare Other | Admitting: Internal Medicine

## 2015-08-12 ENCOUNTER — Inpatient Hospital Stay: Payer: Medicare Other

## 2015-08-12 ENCOUNTER — Encounter: Payer: Self-pay | Admitting: Internal Medicine

## 2015-08-12 VITALS — BP 136/75 | HR 73 | Temp 97.8°F | Resp 18 | Ht 65.0 in | Wt 127.9 lb

## 2015-08-12 DIAGNOSIS — Z9221 Personal history of antineoplastic chemotherapy: Secondary | ICD-10-CM | POA: Diagnosis not present

## 2015-08-12 DIAGNOSIS — Z9010 Acquired absence of unspecified breast and nipple: Secondary | ICD-10-CM | POA: Insufficient documentation

## 2015-08-12 DIAGNOSIS — Z170 Estrogen receptor positive status [ER+]: Secondary | ICD-10-CM | POA: Insufficient documentation

## 2015-08-12 DIAGNOSIS — C50911 Malignant neoplasm of unspecified site of right female breast: Secondary | ICD-10-CM

## 2015-08-12 DIAGNOSIS — Z79899 Other long term (current) drug therapy: Secondary | ICD-10-CM | POA: Diagnosis not present

## 2015-08-12 DIAGNOSIS — Z853 Personal history of malignant neoplasm of breast: Secondary | ICD-10-CM | POA: Diagnosis not present

## 2015-08-12 DIAGNOSIS — C50311 Malignant neoplasm of lower-inner quadrant of right female breast: Secondary | ICD-10-CM

## 2015-08-12 DIAGNOSIS — Z85828 Personal history of other malignant neoplasm of skin: Secondary | ICD-10-CM | POA: Insufficient documentation

## 2015-08-12 LAB — HEPATIC FUNCTION PANEL
ALBUMIN: 4.4 g/dL (ref 3.5–5.0)
ALT: 23 U/L (ref 14–54)
AST: 26 U/L (ref 15–41)
Alkaline Phosphatase: 66 U/L (ref 38–126)
BILIRUBIN DIRECT: 0.1 mg/dL (ref 0.1–0.5)
BILIRUBIN TOTAL: 0.6 mg/dL (ref 0.3–1.2)
Indirect Bilirubin: 0.5 mg/dL (ref 0.3–0.9)
Total Protein: 7.5 g/dL (ref 6.5–8.1)

## 2015-08-12 LAB — CBC WITH DIFFERENTIAL/PLATELET
Basophils Absolute: 0.1 10*3/uL (ref 0–0.1)
Basophils Relative: 1 %
Eosinophils Absolute: 0.7 10*3/uL (ref 0–0.7)
Eosinophils Relative: 9 %
HEMATOCRIT: 42 % (ref 35.0–47.0)
HEMOGLOBIN: 14.2 g/dL (ref 12.0–16.0)
LYMPHS ABS: 2.1 10*3/uL (ref 1.0–3.6)
LYMPHS PCT: 26 %
MCH: 31.4 pg (ref 26.0–34.0)
MCHC: 33.8 g/dL (ref 32.0–36.0)
MCV: 92.6 fL (ref 80.0–100.0)
MONO ABS: 0.9 10*3/uL (ref 0.2–0.9)
MONOS PCT: 11 %
NEUTROS ABS: 4.4 10*3/uL (ref 1.4–6.5)
NEUTROS PCT: 53 %
Platelets: 266 10*3/uL (ref 150–440)
RBC: 4.53 MIL/uL (ref 3.80–5.20)
RDW: 13.3 % (ref 11.5–14.5)
WBC: 8.1 10*3/uL (ref 3.6–11.0)

## 2015-08-12 LAB — CREATININE, SERUM
Creatinine, Ser: 1.19 mg/dL — ABNORMAL HIGH (ref 0.44–1.00)
GFR calc Af Amer: 50 mL/min — ABNORMAL LOW (ref >60)
GFR, EST NON AFRICAN AMERICAN: 43 mL/min — AB (ref >60)

## 2015-08-12 NOTE — Progress Notes (Signed)
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  Patient denies any unusual lumps or bumps. Denies any unusual shortness of breath chest pain or headaches or vision changes.    REVIEW OF SYSTEMS:  A complete 10 point review of system is done which is negative except mentioned above/history of present illness.   PAST MEDICAL HISTORY :  Past Medical History  Diagnosis Date  . 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 History of chemotherapy     Received 5-6 cycles adjuvant chemotherapy with Taxotere, Carboplatin, Herceptin ( last chemotherapy was on 07/21/09, patient discontinued Herceptin in December 2010).  . H/O mammogram 02/02/2015    within normal limits.  . Seizure disorder ([place])   . Brain tumor ([place]) 2006  . Osteoporosis 09/2012    seen on DEXA scan 2013  . Basal cell carcinoma of skin of face 02/2011    PAST SURGICAL HISTORY :   Past Surgical History  Procedure Laterality Date  . Left craniotomy  2006  . Mastectomy with axillary lymph node dissection  January 30, 2009  . Partial hysterectomy      FAMILY HISTORY :   Family History  Problem Relation Age of Onset  . Cancer Maternal Aunt     unknown primary    SOCIAL HISTORY:   Social History  Substance Use Topics  . Smoking status: Never Smoker   . Smokeless tobacco: Never Used  . Alcohol Use: No    ALLERGIES:  is allergic to other and vicodin.  MEDICATIONS:  Current Outpatient Prescriptions  Medication Sig Dispense Refill   . alendronate (FOSAMAX) 70 MG tablet Take 1 tablet by mouth daily.    . Calcium Carbonate-Vitamin D 600-400 MG-UNIT tablet Take 1 tablet by mouth daily. 1    . Cyanocobalamin (VITAMIN B-12 PO) Take by mouth daily.    Marland Kitchen FLUZONE HIGH-DOSE 0.5 ML SUSY     . Multiple Vitamins-Calcium (ONE-A-DAY WOMENS FORMULA PO) Take 1 tablet by mouth daily.    . naproxen (NAPROSYN) 500 MG tablet Take 1 tablet by mouth daily.    Marland Kitchen omeprazole (PRILOSEC) 20 MG capsule     . phenytoin (DILANTIN) 100 MG ER capsule Take 100 mg by mouth 3 (three) times daily.     No current facility-administered medications for this visit.    PHYSICAL EXAMINATION: ECOG PERFORMANCE STATUS: 0 - Asymptomatic  BP 136/75 mmHg  Pulse 73  Temp(Src) 97.8 F (36.6 C) (Tympanic)  Resp 18  Ht '5\' 5"'  (1.651 m)  Wt 127 lb 13.9 oz (58 kg)  BMI 21.28 kg/m2  SpO2 98%  Filed Weights   08/12/15 1450  Weight: 127 lb 13.9 oz (58 kg)    GENERAL: Alert, no distress and comfortable.  She is alone. She is frail-appearing EYES: no pallor or icterus OROPHARYNX: no thrush or ulceration; good dentition  NECK: supple, no masses felt LYMPH:  no palpable lymphadenopathy in the cervical, axillary or inguinal regions LUNGS: clear to auscultation and  No wheeze or crackles HEART/CVS: regular rate &  rhythm and no murmurs; No lower extremity edema ABDOMEN:abdomen soft, non-tender and normal bowel sounds Musculoskeletal:no cyanosis of digits and no clubbing  PSYCH: alert & oriented x 3 with fluent speech NEURO: no focal motor/sensory deficits SKIN:  no rashes or significant lesions Right-mastectomy noted; no lumps or bumps noted in the incision.; Left side- breast within normal limits no skin changes or dominant masses felt.  LABORATORY DATA:  I have reviewed the data as listed    Component Value Date/Time   NA 138 02/22/2015 0423   NA 137 02/21/2015 0417   K 3.6 02/22/2015 0423   K 2.8* 02/21/2015 0417   CL 113* 02/22/2015 0423   CL 110  02/21/2015 0417   CO2 19* 02/22/2015 0423   CO2 19* 02/21/2015 0417   GLUCOSE 116* 02/22/2015 0423   GLUCOSE 122* 02/21/2015 0417   BUN 44* 02/22/2015 0423   BUN 56* 02/21/2015 0417   CREATININE 1.19* 08/12/2015 1431   CREATININE 2.56* 02/21/2015 0417   CALCIUM 6.9* 02/22/2015 0423   CALCIUM 7.1* 02/21/2015 0417   PROT 7.5 08/12/2015 1431   PROT 5.5* 02/21/2015 0417   ALBUMIN 4.4 08/12/2015 1431   ALBUMIN 2.3* 02/21/2015 0417   AST 26 08/12/2015 1431   AST 121* 02/21/2015 0417   ALT 23 08/12/2015 1431   ALT 103* 02/21/2015 0417   ALKPHOS 66 08/12/2015 1431   ALKPHOS 102 02/21/2015 0417   BILITOT 0.6 08/12/2015 1431   BILITOT 2.1* 02/21/2015 0417   GFRNONAA 43* 08/12/2015 1431   GFRNONAA 18* 02/21/2015 0417   GFRNONAA 56* 08/11/2014 1428   GFRAA 50* 08/12/2015 1431   GFRAA 20* 02/21/2015 0417   GFRAA >60 08/11/2014 1428    No results found for: SPEP, UPEP  Lab Results  Component Value Date   WBC 8.1 08/12/2015   NEUTROABS 4.4 08/12/2015   HGB 14.2 08/12/2015   HCT 42.0 08/12/2015   MCV 92.6 08/12/2015   PLT 266 08/12/2015      Chemistry      Component Value Date/Time   NA 138 02/22/2015 0423   NA 137 02/21/2015 0417   K 3.6 02/22/2015 0423   K 2.8* 02/21/2015 0417   CL 113* 02/22/2015 0423   CL 110 02/21/2015 0417   CO2 19* 02/22/2015 0423   CO2 19* 02/21/2015 0417   BUN 44* 02/22/2015 0423   BUN 56* 02/21/2015 0417   CREATININE 1.19* 08/12/2015 1431   CREATININE 2.56* 02/21/2015 0417      Component Value Date/Time   CALCIUM 6.9* 02/22/2015 0423   CALCIUM 7.1* 02/21/2015 0417   ALKPHOS 66 08/12/2015 1431   ALKPHOS 102 02/21/2015 0417   AST 26 08/12/2015 1431   AST 121* 02/21/2015 0417   ALT 23 08/12/2015 1431   ALT 103* 02/21/2015 0417   BILITOT 0.6 08/12/2015 1431   BILITOT 2.1* 02/21/2015 0417       RADIOGRAPHIC STUDIES: I have personally reviewed the radiological images as listed and agreed with the findings in the report. No results  found.   ASSESSMENT & PLAN:   # Right-sided breast cancer stage II ER/PR positive HER-2/neu positive status post mastectomy followed by chemotherapy in 2010. Also finished aromatase inhibitor therapy in December 2015. Clinically no concerns for recurrent disease at this time. Recent mammogram April 2016 of the left side negative.  # Recommend follow-up in approximately one year. Patient did mammogram on the left side in April 2017.  Orders Placed This Encounter  Procedures  . MM Digital Diagnostic Unilat  L    Standing Status: Future     Number of Occurrences:      Standing Expiration Date: 08/11/2016    Order Specific Question:  Reason for Exam (SYMPTOM  OR DIAGNOSIS REQUIRED)    Answer:  screening; hx of breast ccancer    Order Specific Question:  Preferred imaging location?    Answer:  [facility]  . CBC with Differential    Standing Status: Future     Number of Occurrences:      Standing Expiration Date: 08/11/2016  . Comprehensive metabolic panel    Standing Status: Future     Number of Occurrences:      Standing Expiration Date: 08/11/2016   All questions were answered. The patient knows to call the clinic with any problems, questions or concerns. No barriers to learning was detected.  I spent 15 minutes counseling the patient face to face. The total time spent in the appointment was 30 minutes and more than 50% was on counseling and review of test results     Cammie Sickle, MD 08/12/2015 3:21 PM

## 2015-08-12 NOTE — Progress Notes (Signed)
Chaperoned provider with Breast Exam 

## 2015-08-12 NOTE — Addendum Note (Signed)
Addended by: Luella Cook on: 08/12/2015 03:35 PM   Modules accepted: Orders

## 2015-08-13 LAB — CANCER ANTIGEN 27.29: CA 27.29: 11 U/mL (ref 0.0–38.6)

## 2016-02-04 ENCOUNTER — Ambulatory Visit: Payer: Medicare Other

## 2016-02-09 ENCOUNTER — Other Ambulatory Visit: Payer: Self-pay | Admitting: Internal Medicine

## 2016-02-09 ENCOUNTER — Ambulatory Visit
Admission: RE | Admit: 2016-02-09 | Discharge: 2016-02-09 | Disposition: A | Discharge status: Home / Self Care | Payer: Medicare HMO | Source: Ambulatory Visit | Attending: Internal Medicine | Admitting: Internal Medicine

## 2016-02-09 DIAGNOSIS — Z1231 Encounter for screening mammogram for malignant neoplasm of breast: Secondary | ICD-10-CM | POA: Diagnosis present

## 2016-02-09 DIAGNOSIS — C50311 Malignant neoplasm of lower-inner quadrant of right female breast: Secondary | ICD-10-CM

## 2016-02-09 HISTORY — DX: Malignant neoplasm of unspecified site of unspecified female breast: C50.919

## 2016-08-11 ENCOUNTER — Inpatient Hospital Stay: Payer: Medicare HMO | Admitting: Internal Medicine

## 2016-08-11 ENCOUNTER — Inpatient Hospital Stay: Payer: Medicare HMO

## 2016-09-01 ENCOUNTER — Inpatient Hospital Stay: Payer: Medicare HMO | Admitting: Internal Medicine

## 2016-09-01 ENCOUNTER — Inpatient Hospital Stay: Payer: Medicare HMO

## 2016-09-26 ENCOUNTER — Inpatient Hospital Stay: Payer: Medicare HMO

## 2016-09-26 ENCOUNTER — Inpatient Hospital Stay: Payer: Medicare HMO | Admitting: Internal Medicine

## 2016-10-26 ENCOUNTER — Inpatient Hospital Stay: Payer: Medicare HMO | Admitting: Hematology and Oncology

## 2016-10-26 ENCOUNTER — Ambulatory Visit: Payer: Medicare HMO | Admitting: Internal Medicine

## 2016-10-26 ENCOUNTER — Inpatient Hospital Stay: Payer: Medicare HMO

## 2016-10-26 ENCOUNTER — Other Ambulatory Visit: Payer: Medicare HMO

## 2016-11-15 ENCOUNTER — Other Ambulatory Visit: Payer: Self-pay | Admitting: *Deleted

## 2016-11-16 ENCOUNTER — Other Ambulatory Visit: Payer: Medicare HMO

## 2016-11-16 ENCOUNTER — Inpatient Hospital Stay: Payer: Medicare HMO | Attending: Internal Medicine | Admitting: Internal Medicine

## 2016-11-16 ENCOUNTER — Ambulatory Visit: Payer: Medicare HMO | Admitting: Hematology and Oncology

## 2016-11-16 ENCOUNTER — Inpatient Hospital Stay: Payer: Medicare HMO

## 2016-11-16 VITALS — BP 119/73 | HR 84 | Temp 97.7°F | Wt 125.0 lb

## 2016-11-16 DIAGNOSIS — Z9223 Personal history of estrogen therapy: Secondary | ICD-10-CM | POA: Insufficient documentation

## 2016-11-16 DIAGNOSIS — M818 Other osteoporosis without current pathological fracture: Secondary | ICD-10-CM | POA: Insufficient documentation

## 2016-11-16 DIAGNOSIS — N189 Chronic kidney disease, unspecified: Secondary | ICD-10-CM | POA: Insufficient documentation

## 2016-11-16 DIAGNOSIS — Z9221 Personal history of antineoplastic chemotherapy: Secondary | ICD-10-CM | POA: Insufficient documentation

## 2016-11-16 DIAGNOSIS — C50311 Malignant neoplasm of lower-inner quadrant of right female breast: Secondary | ICD-10-CM

## 2016-11-16 DIAGNOSIS — Z170 Estrogen receptor positive status [ER+]: Secondary | ICD-10-CM | POA: Diagnosis not present

## 2016-11-16 DIAGNOSIS — Z9011 Acquired absence of right breast and nipple: Secondary | ICD-10-CM | POA: Diagnosis not present

## 2016-11-16 DIAGNOSIS — M810 Age-related osteoporosis without current pathological fracture: Secondary | ICD-10-CM

## 2016-11-16 DIAGNOSIS — C50811 Malignant neoplasm of overlapping sites of right female breast: Secondary | ICD-10-CM

## 2016-11-16 LAB — CBC WITH DIFFERENTIAL/PLATELET
BASOS ABS: 0 10*3/uL (ref 0–0.1)
BASOS PCT: 1 %
EOS ABS: 0.2 10*3/uL (ref 0–0.7)
EOS PCT: 3 %
HCT: 43.5 % (ref 35.0–47.0)
Hemoglobin: 14.9 g/dL (ref 12.0–16.0)
LYMPHS PCT: 22 %
Lymphs Abs: 1.6 10*3/uL (ref 1.0–3.6)
MCH: 32.5 pg (ref 26.0–34.0)
MCHC: 34.4 g/dL (ref 32.0–36.0)
MCV: 94.7 fL (ref 80.0–100.0)
MONO ABS: 0.8 10*3/uL (ref 0.2–0.9)
Monocytes Relative: 10 %
Neutro Abs: 4.8 10*3/uL (ref 1.4–6.5)
Neutrophils Relative %: 64 %
PLATELETS: 229 10*3/uL (ref 150–440)
RBC: 4.59 MIL/uL (ref 3.80–5.20)
RDW: 12.5 % (ref 11.5–14.5)
WBC: 7.5 10*3/uL (ref 3.6–11.0)

## 2016-11-16 LAB — COMPREHENSIVE METABOLIC PANEL
ALT: 26 U/L (ref 14–54)
AST: 27 U/L (ref 15–41)
Albumin: 4.3 g/dL (ref 3.5–5.0)
Alkaline Phosphatase: 65 U/L (ref 38–126)
Anion gap: 8 (ref 5–15)
BUN: 17 mg/dL (ref 6–20)
CHLORIDE: 102 mmol/L (ref 101–111)
CO2: 25 mmol/L (ref 22–32)
CREATININE: 1.2 mg/dL — AB (ref 0.44–1.00)
Calcium: 9.5 mg/dL (ref 8.9–10.3)
GFR calc Af Amer: 49 mL/min — ABNORMAL LOW (ref >60)
GFR calc non Af Amer: 42 mL/min — ABNORMAL LOW (ref >60)
GLUCOSE: 122 mg/dL — AB (ref 65–99)
Potassium: 3.6 mmol/L (ref 3.5–5.1)
Sodium: 135 mmol/L (ref 135–145)
Total Bilirubin: 0.6 mg/dL (ref 0.3–1.2)
Total Protein: 7.5 g/dL (ref 6.5–8.1)

## 2016-11-16 NOTE — Progress Notes (Signed)
Patient here for follow up

## 2016-11-16 NOTE — Assessment & Plan Note (Addendum)
#  Right-sided breast cancer stage II ER/PR positive HER-2/neu positive status post mastectomy followed by chemotherapy in 2010. Also finished aromatase inhibitor therapy in December 2015.  #  Clinically no concerns for recurrent disease at this time. Recent mammogram April 2017 of the left side negative.  # Osteoporosis- on ca+vit D; on Fosomax. recheck BMD with mammogram.   # CKD- creatinine- 1.2/stable.   # Recommend follow-up in approximately one year-labs.  Screening mammogram in April 2018.   Cc; Dr.walker.

## 2016-11-16 NOTE — Progress Notes (Signed)
[facility] OFFICE PROGRESS NOTE  Patient Care Team: Madelyn Brunner, MD as PCP - General (Internal Medicine)   SUMMARY OF ONCOLOGIC HISTORY:  Oncology History   # 2010- RIGHT BREAST CA; IDC; STAGE IIB ER/PR POS; Her2 Neu POS s/p MASTEC;  [place]; s/p exemestane [finished Dec 2015]  # Osteoporosis     Carcinoma of overlapping sites of right breast in female, estrogen receptor positive ([place])     INTERVAL HISTORY:  76 year old female patient with above history of stage II breast cancer ER/PR positive HER-2/neu positive status post mastectomy; followed by chemotherapy; adjuvant aromatase inhibitor [Finished December 2015] is here for follow-up.  No shortness of breath or chest pain. No headaches. No lumps or bumps.   REVIEW OF SYSTEMS:  A complete 10 point review of system is done which is negative except mentioned above/history of present illness.   PAST MEDICAL HISTORY :  Past Medical History:  Diagnosis Date  . Basal cell carcinoma of skin of face 02/2011  . Brain tumor ([place]) 2006  . Breast cancer ([place]) 2010   right mastectomy  . 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 H/O mammogram 02/02/2015   within normal limits.  . History of chemotherapy    Received 5-6 cycles adjuvant chemotherapy with Taxotere, Carboplatin, Herceptin ( last chemotherapy was on 07/21/09, patient discontinued Herceptin in December 2010).  . Osteoporosis 09/2012   seen on DEXA scan 2013  . Seizure disorder ([place])     PAST SURGICAL HISTORY :   Past Surgical History:  Procedure Laterality Date  . left craniotomy  2006  . MASTECTOMY WITH AXILLARY LYMPH NODE DISSECTION  January 30, 2009  . PARTIAL HYSTERECTOMY      FAMILY HISTORY :   Family History  Problem Relation Age of Onset  . Cancer Maternal Aunt     unknown primary  . Breast cancer Neg Hx     SOCIAL HISTORY:   Social History  Substance Use Topics  .  Smoking status: Never Smoker  . Smokeless tobacco: Never Used  . Alcohol use No    ALLERGIES:  is allergic to other and vicodin [hydrocodone-acetaminophen].  MEDICATIONS:  Current Outpatient Prescriptions  Medication Sig Dispense Refill  . alendronate (FOSAMAX) 70 MG tablet Take 1 tablet by mouth once a week.     . Calcium Carbonate-Vitamin D 600-400 MG-UNIT tablet Take 1 tablet by mouth daily. 1    . Cyanocobalamin (VITAMIN B-12 PO) Take by mouth daily.    . Multiple Vitamins-Calcium (ONE-A-DAY WOMENS FORMULA PO) Take 1 tablet by mouth daily.    . naproxen (NAPROSYN) 500 MG tablet Take 1 tablet by mouth 3 (three) times daily with meals.     Marland Kitchen omeprazole (PRILOSEC) 20 MG capsule Take 20 mg by mouth daily.     . phenytoin (DILANTIN) 100 MG ER capsule Take 100 mg by mouth 3 (three) times daily.     No current facility-administered medications for this visit.     PHYSICAL EXAMINATION: ECOG PERFORMANCE STATUS: 0 - Asymptomatic  BP 119/73 (BP Location: Left Arm, Patient Position: Sitting)   Pulse 84   Temp 97.7 F (36.5 C) (Tympanic)   Wt 125 lb (56.7 kg)   BMI 20.80 kg/m   Filed Weights   11/16/16 1417  Weight: 125 lb (56.7 kg)    GENERAL: Alert, no distress and comfortable.  She is alone. She is frail-appearing. Walks with  arolling walker.  EYES: no pallor or icterus OROPHARYNX: no thrush or ulceration; good dentition  NECK: supple, no masses felt LYMPH:  no palpable lymphadenopathy in the cervical, axillary or inguinal regions LUNGS: clear to auscultation and  No wheeze or crackles HEART/CVS: regular rate & rhythm and no murmurs; No lower extremity edema ABDOMEN:abdomen soft, non-tender and normal bowel sounds Musculoskeletal:no cyanosis of digits and no clubbing  PSYCH: alert & oriented x 3 with fluent speech NEURO: no focal motor/sensory deficits SKIN:  no rashes or significant lesions Right-mastectomy noted; no lumps or bumps noted in the incision.; Left side- breast  within normal limits no skin changes or dominant masses felt.  LABORATORY DATA:  I have reviewed the data as listed    Component Value Date/Time   NA 135 11/16/2016 1347   NA 137 02/21/2015 0417   K 3.6 11/16/2016 1347   K 2.8 (L) 02/21/2015 0417   CL 102 11/16/2016 1347   CL 110 02/21/2015 0417   CO2 25 11/16/2016 1347   CO2 19 (L) 02/21/2015 0417   GLUCOSE 122 (H) 11/16/2016 1347   GLUCOSE 122 (H) 02/21/2015 0417   BUN 17 11/16/2016 1347   BUN 56 (H) 02/21/2015 0417   CREATININE 1.20 (H) 11/16/2016 1347   CREATININE 2.56 (H) 02/21/2015 0417   CALCIUM 9.5 11/16/2016 1347   CALCIUM 7.1 (L) 02/21/2015 0417   PROT 7.5 11/16/2016 1347   PROT 5.5 (L) 02/21/2015 0417   ALBUMIN 4.3 11/16/2016 1347   ALBUMIN 2.3 (L) 02/21/2015 0417   AST 27 11/16/2016 1347   AST 121 (H) 02/21/2015 0417   ALT 26 11/16/2016 1347   ALT 103 (H) 02/21/2015 0417   ALKPHOS 65 11/16/2016 1347   ALKPHOS 102 02/21/2015 0417   BILITOT 0.6 11/16/2016 1347   BILITOT 2.1 (H) 02/21/2015 0417   GFRNONAA 42 (L) 11/16/2016 1347   GFRNONAA 18 (L) 02/21/2015 0417   GFRAA 49 (L) 11/16/2016 1347   GFRAA 20 (L) 02/21/2015 0417    No results found for: SPEP, UPEP  Lab Results  Component Value Date   WBC 7.5 11/16/2016   NEUTROABS 4.8 11/16/2016   HGB 14.9 11/16/2016   HCT 43.5 11/16/2016   MCV 94.7 11/16/2016   PLT 229 11/16/2016      Chemistry      Component Value Date/Time   NA 135 11/16/2016 1347   NA 137 02/21/2015 0417   K 3.6 11/16/2016 1347   K 2.8 (L) 02/21/2015 0417   CL 102 11/16/2016 1347   CL 110 02/21/2015 0417   CO2 25 11/16/2016 1347   CO2 19 (L) 02/21/2015 0417   BUN 17 11/16/2016 1347   BUN 56 (H) 02/21/2015 0417   CREATININE 1.20 (H) 11/16/2016 1347   CREATININE 2.56 (H) 02/21/2015 0417      Component Value Date/Time   CALCIUM 9.5 11/16/2016 1347   CALCIUM 7.1 (L) 02/21/2015 0417   ALKPHOS 65 11/16/2016 1347   ALKPHOS 102 02/21/2015 0417   AST 27 11/16/2016 1347   AST 121  (H) 02/21/2015 0417   ALT 26 11/16/2016 1347   ALT 103 (H) 02/21/2015 0417   BILITOT 0.6 11/16/2016 1347   BILITOT 2.1 (H) 02/21/2015 0417       RADIOGRAPHIC STUDIES: I have personally reviewed the radiological images as listed and agreed with the findings in the report. No results found.   ASSESSMENT & PLAN:  Carcinoma of overlapping sites of right breast in female, estrogen receptor positive ([place]) # Right-sided  breast cancer stage II ER/PR positive HER-2/neu positive status post mastectomy followed by chemotherapy in 2010. Also finished aromatase inhibitor therapy in December 2015.  #  Clinically no concerns for recurrent disease at this time. Recent mammogram April 2017 of the left side negative.  # Osteoporosis- on ca+vit D; on Fosomax. recheck BMD with mammogram.   # CKD- creatinine- 1.2/stable.   # Recommend follow-up in approximately one year-labs.  Screening mammogram in April 2018.   Cc; Dr.walker.        Cammie Sickle, MD 11/16/2016 4:50 PM

## 2017-02-13 ENCOUNTER — Ambulatory Visit: Payer: Medicare HMO

## 2017-03-22 ENCOUNTER — Other Ambulatory Visit: Payer: Medicare HMO

## 2017-03-22 ENCOUNTER — Ambulatory Visit: Payer: Medicare HMO

## 2017-05-04 ENCOUNTER — Ambulatory Visit
Admission: RE | Admit: 2017-05-04 | Discharge: 2017-05-04 | Disposition: A | Discharge status: Home / Self Care | Payer: Medicare HMO | Source: Ambulatory Visit | Attending: Internal Medicine | Admitting: Internal Medicine

## 2017-05-04 DIAGNOSIS — Z170 Estrogen receptor positive status [ER+]: Secondary | ICD-10-CM | POA: Insufficient documentation

## 2017-05-04 DIAGNOSIS — C50811 Malignant neoplasm of overlapping sites of right female breast: Secondary | ICD-10-CM | POA: Diagnosis not present

## 2017-05-04 DIAGNOSIS — M419 Scoliosis, unspecified: Secondary | ICD-10-CM | POA: Diagnosis not present

## 2017-05-04 DIAGNOSIS — Z1231 Encounter for screening mammogram for malignant neoplasm of breast: Secondary | ICD-10-CM | POA: Insufficient documentation

## 2017-05-04 DIAGNOSIS — M810 Age-related osteoporosis without current pathological fracture: Secondary | ICD-10-CM | POA: Diagnosis not present

## 2017-05-12 ENCOUNTER — Telehealth: Payer: Self-pay | Admitting: *Deleted

## 2017-05-12 NOTE — Telephone Encounter (Signed)
Left vm for patient to return our phone call. 05/11/2017 at 4:08 PM

## 2017-05-12 NOTE — Telephone Encounter (Signed)
-----   Message from Cammie Sickle, MD sent at 05/10/2017  6:12 PM EDT ----- Please inform patient that her bone density continues to show osteoporosis/to the same degree as the previous bone density. Patient is currently on Fosamax; if she is interested in prolia- I could see her in the next 2-3 weeks to discuss the treatment options. Otherwise she'll follow-up with me as scheduled.

## 2017-05-16 NOTE — Telephone Encounter (Signed)
Patient states that she does not think that she is taking Fosamax, she takes Vit D everyday. Advised pt that we could discuss Prolia injection given once every 6 mths. Patient was very agitated that I suggested this medication , Pt stated "i am on enough medication" and hung up the phone.

## 2017-10-04 ENCOUNTER — Ambulatory Visit: Payer: Self-pay | Admitting: Urology

## 2017-10-14 ENCOUNTER — Other Ambulatory Visit: Payer: Self-pay

## 2017-10-14 ENCOUNTER — Emergency Department
Admission: EM | Admit: 2017-10-14 | Discharge: 2017-10-14 | Disposition: A | Discharge status: Home / Self Care | Payer: Medicare HMO | Attending: Emergency Medicine | Admitting: Emergency Medicine

## 2017-10-14 DIAGNOSIS — Z85828 Personal history of other malignant neoplasm of skin: Secondary | ICD-10-CM | POA: Insufficient documentation

## 2017-10-14 DIAGNOSIS — N3000 Acute cystitis without hematuria: Secondary | ICD-10-CM | POA: Diagnosis not present

## 2017-10-14 DIAGNOSIS — Z853 Personal history of malignant neoplasm of breast: Secondary | ICD-10-CM | POA: Insufficient documentation

## 2017-10-14 DIAGNOSIS — Z79899 Other long term (current) drug therapy: Secondary | ICD-10-CM | POA: Diagnosis not present

## 2017-10-14 DIAGNOSIS — R350 Frequency of micturition: Secondary | ICD-10-CM | POA: Diagnosis present

## 2017-10-14 LAB — URINALYSIS, COMPLETE (UACMP) WITH MICROSCOPIC
Bilirubin Urine: NEGATIVE
GLUCOSE, UA: NEGATIVE mg/dL
Hgb urine dipstick: NEGATIVE
KETONES UR: NEGATIVE mg/dL
Nitrite: NEGATIVE
PROTEIN: NEGATIVE mg/dL
Specific Gravity, Urine: 1.003 — ABNORMAL LOW (ref 1.005–1.030)
pH: 8 (ref 5.0–8.0)

## 2017-10-14 MED ORDER — PHENAZOPYRIDINE HCL 100 MG PO TABS: 100.0000 mg | ORAL_TABLET | Freq: Three times a day (TID) | ORAL | 0 refills | Status: DC | PRN

## 2017-10-14 MED ORDER — CEPHALEXIN 500 MG PO CAPS: 500.0000 mg | ORAL_CAPSULE | Freq: Three times a day (TID) | ORAL | 0 refills | Status: DC

## 2017-10-14 NOTE — Discharge Instructions (Signed)
Began taking antibiotics as directed. Finish the entire 10 days. Also the Pyridium is prescribed for bladder spasms. It will cause your  urine to turn a different color. Follow-up with your primary care provider if any continued problems.

## 2017-10-14 NOTE — ED Provider Notes (Signed)
[facility] Emergency Department Provider Note   ____________________________________________   First MD Initiated Contact with Patient 10/14/17 1247     (approximate)  I have reviewed the triage vital signs and the nursing notes.   HISTORY  Chief Complaint Urinary Frequency   HPI [name] is a 76 y.o. female is brought to the ED by family with complaint of urinary frequency. Patient denies any burning, fever, chills, nausea or vomiting. She states she has had urinary tract infections before. She states symptoms have been going on for 1 week. Family member states that she "always waits until it  gets bad".   Past Medical History:  Diagnosis Date  . Basal cell carcinoma of skin of face 02/2011  . Brain tumor ([place]) 2006  . Breast cancer ([place]) 2010   right mastectomy  . 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 H/O mammogram 02/02/2015   within normal limits.  . History of chemotherapy    Received 5-6 cycles adjuvant chemotherapy with Taxotere, Carboplatin, Herceptin ( last chemotherapy was on 07/21/09, patient discontinued Herceptin in December 2010).  . Osteoporosis 09/2012   seen on DEXA scan 2013  . Seizure disorder [facility])     Patient Active Problem List   Diagnosis Date Noted  . Carcinoma of overlapping sites of right breast in female, estrogen receptor positive ([place]) 11/16/2016  . 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 OP (osteoporosis) 04/07/2014    Past Surgical History:  Procedure Laterality Date  . left craniotomy  2006  . MASTECTOMY Right   . MASTECTOMY WITH AXILLARY LYMPH NODE DISSECTION  January 30, 2009  . PARTIAL HYSTERECTOMY      Prior to Admission medications   Medication Sig Start Date End Date Taking? Authorizing Provider  alendronate (FOSAMAX) 70 MG tablet Take 1 tablet by mouth once a week.  07/08/15   [provider]  Calcium  Carbonate-Vitamin D 600-400 MG-UNIT tablet Take 1 tablet by mouth daily. 1    [provider]  cephALEXin (KEFLEX) 500 MG capsule Take 1 capsule (500 mg total) by mouth 3 (three) times daily. 10/14/17   Johnn Hai, PA-C  Cyanocobalamin (VITAMIN B-12 PO) Take by mouth daily.    [provider]  Multiple Vitamins-Calcium (ONE-A-DAY WOMENS FORMULA PO) Take 1 tablet by mouth daily.    [provider]  naproxen (NAPROSYN) 500 MG tablet Take 1 tablet by mouth 3 (three) times daily with meals.  07/27/15   [provider]  omeprazole (PRILOSEC) 20 MG capsule Take 20 mg by mouth daily.  08/10/15   [provider]  phenazopyridine (PYRIDIUM) 100 MG tablet Take 1 tablet (100 mg total) by mouth 3 (three) times daily as needed for pain. 10/14/17 10/14/18  Johnn Hai, PA-C  phenytoin (DILANTIN) 100 MG ER capsule Take 100 mg by mouth 3 (three) times daily.    [provider]    Allergies Other and Vicodin [hydrocodone-acetaminophen]  Family History  Problem Relation Age of Onset  . Cancer Maternal Aunt        unknown primary  . Breast cancer Neg Hx     Social History Social History   Tobacco Use  . Smoking status: Never Smoker  . Smokeless tobacco: Never Used  Substance Use Topics  . Alcohol use: No    Alcohol/week: 0.0 oz  . Drug use: No    Review of Systems Constitutional: No  fever/chills Cardiovascular: Denies chest pain. Respiratory: Denies shortness of breath. Gastrointestinal: No abdominal pain.  No nausea, no vomiting.   Genitourinary: positive for dysuria. Musculoskeletal: Negative for back pain. Skin: Negative for rash. Neurological: Negative for headaches, focal weakness or numbness. ___________________________________________   PHYSICAL EXAM:  VITAL SIGNS: ED Triage Vitals  Enc Vitals Group     BP 10/14/17 1209 (!) 141/74     Pulse Rate 10/14/17 1209 82     Resp 10/14/17 1209 18     Temp 10/14/17 1209  97.6 F (36.4 C)     Temp Source 10/14/17 1209 Oral     SpO2 10/14/17 1209 99 %     Weight 10/14/17 1210 120 lb (54.4 kg)     Height 10/14/17 1210 _0  (1.575 m)     Head Circumference --      Peak Flow --      Pain Score 10/14/17 1209 0     Pain Loc --      Pain Edu? --      Excl. in [place]? --    Constitutional: Alert and oriented. Well appearing and in no acute distress. Eyes: Conjunctivae are normal.  Head: Atraumatic. Neck: No stridor.   Cardiovascular: Normal rate, regular rhythm. Grossly normal heart sounds.  Good peripheral circulation. Respiratory: Normal respiratory effort.  No retractions. Lungs CTAB. Gastrointestinal: Soft and nontender. No distention.  No CVA tenderness. Neurologic:  Normal speech and language. No gross focal neurologic deficits are appreciated.  Skin:  Skin is warm, dry and intact.  Psychiatric: Mood and affect are normal. Speech and behavior are normal.  ____________________________________________   LABS (all labs ordered are listed, but only abnormal results are displayed)  Labs Reviewed  URINALYSIS, COMPLETE (UACMP) WITH MICROSCOPIC - Abnormal; Notable for the following components:      Result Value   Color, Urine YELLOW (*)    APPearance HAZY (*)    Specific Gravity, Urine 1.003 (*)    Leukocytes, UA LARGE (*)    Bacteria, UA RARE (*)    Squamous Epithelial / LPF 0-5 (*)    All other components within normal limits  URINE CULTURE   _ PROCEDURES  Procedure(s) performed: None  Procedures  Critical Care performed: No  ____________________________________________   INITIAL IMPRESSION / ASSESSMENT AND PLAN / ED COURSE Patient was given Keflex 500 mg 3 times a day for 10 days along with a prescription for Pyridium 100 mg 3 times a day when necessary. Patient is encouraged to increase fluids. She may take Tylenol if needed. A culture was ordered and she is also encouraged to follow-up with her PCP if any continued  problems.  ____________________________________________   FINAL CLINICAL IMPRESSION(S) / ED DIAGNOSES  Final diagnoses:  Acute cystitis without hematuria     ED Discharge Orders        Ordered    cephALEXin (KEFLEX) 500 MG capsule  3 times daily     10/14/17 1255    phenazopyridine (PYRIDIUM) 100 MG tablet  3 times daily PRN     10/14/17 1255       Note:  This document was prepared using Dragon voice recognition software and may include unintentional dictation errors.    Johnn Hai, PA-C 10/14/17 [place], Randall An, MD 10/14/17 [phone]

## 2017-10-14 NOTE — ED Triage Notes (Signed)
Pt arrives to ED with c/o of urinary frequency. Denies burning. Denies fever. Alert and oriented. Denies pain.

## 2017-10-16 LAB — URINE CULTURE
Culture: >=100000 — AB
Special Requests: NORMAL

## 2017-10-29 NOTE — Progress Notes (Signed)
10/30/2017 8:55 PM   [name] 1941/01/08 [id number]  Referring provider: Madelyn Brunner, MD No address on file  Chief Complaint  Patient presents with  . Urinary Incontinence    HPI: Patient is a 77 -year-old Caucasian female who is referred to [place] by, Dr. Edwina Barth, for urinary incontinence.    Patient is a poor historian and has a poor understanding of her urological condition.  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   There is no documented past history of ovarian tumors in her chart.  When I asked her where she received this information, she state Dr. Madelin Headings told her many years ago.    She was also ambivalent on her cathing routine.  On one hand she stated she couldn't cath herself anymore due to the "ovarian tumor" blocking the bladder stem and then later in the conversation she would state she would cath herself and get "plenty of urine out."  She was very insistent that Dr. Edwina Barth refill her catheters, but she denied the need for caths when I asked her about needing a refill.    She states that she has to use the restroom several times during the day and has nocturia x 5.  She also complained of intermittency.  She did admit that she loses urine when she cannot get to the bathroom on time.    She does not have a history of urinary tract infections, STI's or injury to the bladder.     She denies dysuria, gross hematuria, suprapubic pain, back pain, abdominal pain or flank pain.  She has not had any recent fevers, chills, nausea or vomiting.   She does not have a history of nephrolithiasis, GU surgery or GU trauma.   She s not sexually active.  She is post menopausal, but she has a history of breast cancer.    She denies constipation and/or diarrhea.   She is not having pain with bladder filling.    She has not had any recent imaging studies.  Her UA  was negative.  Her PVR is 136 mL.    She is drinking not drinking a lot of water daily.   She is drinking 4 caffeinated beverages daily.  She is not drinking alcoholic beverages daily.    Reviewed referral notes.    Patient has been cathing herself for a number of years.  She has been reusing the same catheter for a number of years.     PMH: Past Medical History:  Diagnosis Date  . Basal cell carcinoma of skin of face 02/2011  . Brain tumor ([place]) 2006  . Breast cancer ([place]) 2010   right mastectomy  . 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 H/O mammogram 02/02/2015   within normal limits.  . History of chemotherapy    Received 5-6 cycles adjuvant chemotherapy with Taxotere, Carboplatin, Herceptin ( last chemotherapy was on 07/21/09, patient discontinued Herceptin in December 2010).  . Osteoporosis 09/2012   seen on DEXA scan 2013  . Seizure disorder [facility])     Surgical History: Past Surgical History:  Procedure Laterality Date  . left craniotomy  2006  . MASTECTOMY Right   . MASTECTOMY WITH AXILLARY LYMPH NODE DISSECTION  January 30, 2009  . PARTIAL HYSTERECTOMY  Home Medications:  Allergies as of 10/30/2017      Reactions   Other Diarrhea   Food Allergy to Bananas- cause diarrhea Band Aids cause Rash   Vicodin [hydrocodone-acetaminophen] Other (See Comments)   GI Distress      Medication List        Accurate as of 10/30/17 11:59 PM. Always use your most recent med list.          alendronate 70 MG tablet Commonly known as:  FOSAMAX Take 1 tablet by mouth once a week.   Calcium Carbonate-Vitamin D 600-400 MG-UNIT tablet Take 1 tablet by mouth daily. 1   naproxen 500 MG tablet Commonly known as:  NAPROSYN Take 1 tablet by mouth 3 (three) times daily with meals.   omeprazole 20 MG capsule Commonly known as:  PRILOSEC Take 20 mg by mouth daily.   ONE-A-DAY WOMENS FORMULA PO Take 1 tablet by  mouth daily.   phenytoin 100 MG ER capsule Commonly known as:  DILANTIN Take 100 mg by mouth 3 (three) times daily.   VITAMIN B-12 PO Take by mouth daily.       Allergies:  Allergies  Allergen Reactions  . Other Diarrhea    Food Allergy to Bananas- cause diarrhea Band Aids cause Rash   . Vicodin [Hydrocodone-Acetaminophen] Other (See Comments)    GI Distress    Family History: Family History  Problem Relation Age of Onset  . Cancer Maternal Aunt        unknown primary  . Breast cancer Neg Hx   . Bladder Cancer Neg Hx   . Kidney cancer Neg Hx     Social History:  reports that  has never smoked. she has never used smokeless tobacco. She reports that she does not drink alcohol or use drugs.  ROS: UROLOGY Frequent Urination?: Yes Hard to postpone urination?: No Burning/pain with urination?: No Get up at night to urinate?: Yes Leakage of urine?: No Urine stream starts and stops?: Yes Trouble starting stream?: No Do you have to strain to urinate?: No Blood in urine?: No Urinary tract infection?: Yes Sexually transmitted disease?: No Injury to kidneys or bladder?: No Painful intercourse?: No Weak stream?: No Currently pregnant?: No Vaginal bleeding?: No Last menstrual period?: n  Gastrointestinal Nausea?: No Vomiting?: No Indigestion/heartburn?: No Diarrhea?: No Constipation?: No  Constitutional Fever: No Night sweats?: Yes Weight loss?: Yes Fatigue?: Yes  Skin Skin rash/lesions?: No Itching?: No  Eyes Blurred vision?: No Double vision?: No  Ears/Nose/Throat Sore throat?: No Sinus problems?: No  Hematologic/Lymphatic Swollen glands?: No Easy bruising?: No  Cardiovascular Leg swelling?: No Chest pain?: No  Respiratory Cough?: No Shortness of breath?: No  Endocrine Excessive thirst?: No  Musculoskeletal Back pain?: No Joint pain?: No  Neurological Headaches?: No Dizziness?: No  Psychologic Depression?: No Anxiety?:  No  Physical Exam: BP (!) 145/86 (BP Location: Right Arm, Patient Position: Sitting, Cuff Size: Normal)   Pulse 90   Ht '5\' 2"'  (1.575 m)   Wt 123 lb 11.2 oz (56.1 kg)   BMI 22.63 kg/m   Constitutional: Well nourished. Alert and oriented, No acute distress. HEENT: [place] AT, moist mucus membranes. Trachea midline, no masses. Cardiovascular: No clubbing, cyanosis, or edema. Respiratory: Normal respiratory effort, no increased work of breathing. GI: Abdomen is soft, non tender, non distended, no abdominal masses. Liver and spleen not palpable.  No hernias appreciated.  Stool sample for occult testing is not indicated.   GU: No CVA tenderness.  No bladder fullness  or masses.  Atrophic external genitalia, normal pubic hair distribution, no lesions.  Normal urethral meatus, no lesions, no prolapse, no discharge.   No urethral masses, tenderness and/or tenderness. No bladder fullness, tenderness or masses. Pale vagina mucosa, poor estrogen effect, no discharge, no lesions, good pelvic support, Grade II cystocele.  No rectocele noted.  No pelvic masses noted.  Cervix are surgically absent.  Anus and perineum are without rashes or lesions.    Skin: No rashes, bruises or suspicious lesions. Lymph: No cervical or inguinal adenopathy. Neurologic: Grossly intact, no focal deficits, moving all 4 extremities. Psychiatric: Normal mood and affect.  Laboratory Data: Lab Results  Component Value Date   WBC 7.5 11/16/2016   HGB 14.9 11/16/2016   HCT 43.5 11/16/2016   MCV 94.7 11/16/2016   PLT 229 11/16/2016    Lab Results  Component Value Date   CREATININE 1.20 (H) 11/16/2016    Lab Results  Component Value Date   AST 27 11/16/2016   Lab Results  Component Value Date   ALT 26 11/16/2016   Urinalysis Negative.  See Epic.   I have reviewed the labs.   Pertinent Imaging: Results for SHAWNTELL, DIXSON (MRN [id number]) as of 11/05/2017 20:51  Ref. Range 10/30/2017 15:17  Scan Result Unknown 136      Assessment & Plan:    1. Urge incontinence  - offered behavioral therapies, bladder training, bladder control strategies and pelvic floor muscle training - patient declined - but will call back if she decides to have PT  - fluid management - encouraged more water intake  - offered medical therapy with anticholinergic therapy or beta-3 adrenergic receptor agonist and the potential side effects of each therapy - patient declined   - offered PTNS - patient declined  - patient stated if her symptoms get worse she will call back for an appointment  2. Vaginal atrophy  - patient is not a candidate due to history of breast cancer   Return if symptoms worsen or fail to improve.  These notes generated with voice recognition software. I apologize for typographical errors.  Zara Council, [facility] [address], [place], [place] [address] [phone]

## 2017-10-30 ENCOUNTER — Ambulatory Visit: Payer: Medicare Other | Admitting: Urology

## 2017-10-30 ENCOUNTER — Encounter: Payer: Self-pay | Admitting: Urology

## 2017-10-30 VITALS — BP 145/86 | HR 90 | Ht 62.0 in | Wt 123.7 lb

## 2017-10-30 DIAGNOSIS — N3941 Urge incontinence: Secondary | ICD-10-CM | POA: Diagnosis not present

## 2017-10-30 DIAGNOSIS — N952 Postmenopausal atrophic vaginitis: Secondary | ICD-10-CM | POA: Diagnosis not present

## 2017-10-30 DIAGNOSIS — N3946 Mixed incontinence: Secondary | ICD-10-CM

## 2017-10-30 LAB — URINALYSIS, COMPLETE
Bilirubin, UA: NEGATIVE
Glucose, UA: NEGATIVE
KETONES UA: NEGATIVE
Leukocytes, UA: NEGATIVE
NITRITE UA: NEGATIVE
Protein, UA: NEGATIVE
RBC, UA: NEGATIVE
Specific Gravity, UA: 1.015 (ref 1.005–1.030)
UUROB: 0.2 mg/dL (ref 0.2–1.0)
pH, UA: 7.5 (ref 5.0–7.5)

## 2017-10-30 LAB — BLADDER SCAN AMB NON-IMAGING: SCAN RESULT: 136

## 2017-11-02 ENCOUNTER — Telehealth: Payer: Self-pay

## 2017-11-02 LAB — CULTURE, URINE COMPREHENSIVE

## 2017-11-02 NOTE — Telephone Encounter (Signed)
-----   Message from Nori Riis, PA-C sent at 11/02/2017  1:27 PM EST ----- Please let Mrs. Suder know that her urine culture was negative.

## 2017-11-02 NOTE — Telephone Encounter (Signed)
Patient notified on vmail 

## 2017-11-17 ENCOUNTER — Inpatient Hospital Stay: Payer: Medicare Other | Admitting: Internal Medicine

## 2017-12-04 ENCOUNTER — Encounter: Payer: Self-pay | Admitting: Internal Medicine

## 2017-12-04 ENCOUNTER — Inpatient Hospital Stay: Payer: Medicare Other | Attending: Internal Medicine | Admitting: Internal Medicine

## 2017-12-04 VITALS — BP 124/78 | Resp 16 | Wt 119.2 lb

## 2017-12-04 DIAGNOSIS — Z853 Personal history of malignant neoplasm of breast: Secondary | ICD-10-CM

## 2017-12-04 DIAGNOSIS — Z9221 Personal history of antineoplastic chemotherapy: Secondary | ICD-10-CM | POA: Insufficient documentation

## 2017-12-04 DIAGNOSIS — Z170 Estrogen receptor positive status [ER+]: Secondary | ICD-10-CM | POA: Diagnosis not present

## 2017-12-04 DIAGNOSIS — Z79899 Other long term (current) drug therapy: Secondary | ICD-10-CM | POA: Insufficient documentation

## 2017-12-04 DIAGNOSIS — Z9011 Acquired absence of right breast and nipple: Secondary | ICD-10-CM | POA: Insufficient documentation

## 2017-12-04 DIAGNOSIS — Z9223 Personal history of estrogen therapy: Secondary | ICD-10-CM | POA: Insufficient documentation

## 2017-12-04 DIAGNOSIS — Z85828 Personal history of other malignant neoplasm of skin: Secondary | ICD-10-CM | POA: Diagnosis not present

## 2017-12-04 DIAGNOSIS — C50811 Malignant neoplasm of overlapping sites of right female breast: Secondary | ICD-10-CM

## 2017-12-04 DIAGNOSIS — M810 Age-related osteoporosis without current pathological fracture: Secondary | ICD-10-CM | POA: Diagnosis not present

## 2017-12-04 NOTE — Assessment & Plan Note (Addendum)
#  Right-sided breast cancer stage II ER/PR positive HER-2/neu positive status post mastectomy followed by chemotherapy in 2010. Also finished aromatase inhibitor therapy in December 2015.  #  Clinically no concerns for recurrent disease at this time. Recent mammogram April 2018 of the left side negative.  # July 2018- Osteoporosis- on ca+vit D; on Fosomax; discussed regarding Prolia /subcu bisphosphonates.  Patient is reluctant. # CKD- creatinine- 1.2/stable.   # follow up in 12 months.   Cc; Dr.John[place]

## 2017-12-04 NOTE — Progress Notes (Signed)
[place] OFFICE PROGRESS NOTE  Patient Care Team: Baxter Hire, MD as PCP - General (Internal Medicine)   SUMMARY OF ONCOLOGIC HISTORY:  Oncology History   # 2010- RIGHT BREAST CA; IDC; STAGE IIB ER/PR POS; Her2 Neu POS s/p MASTEC;  [place]; s/p exemestane [finished Dec 2015]  # Osteoporosis     Carcinoma of overlapping sites of right breast in female, estrogen receptor positive ([place])     INTERVAL HISTORY:  77 year old female patient with above history of stage II breast cancer ER/PR positive HER-2/neu positive status post mastectomy; followed by chemotherapy; adjuvant aromatase inhibitor [Finished December 2015] is here for follow-up.  No nausea no vomiting.  No shortness of breath or chest pain. No headaches. No lumps or bumps.   REVIEW OF SYSTEMS:  A complete 10 point review of system is done which is negative except mentioned above/history of present illness.   PAST MEDICAL HISTORY :  Past Medical History:  Diagnosis Date  . Basal cell carcinoma of skin of face 02/2011  . Brain tumor ([place]) 2006  . Breast cancer ([place]) 2010   right mastectomy  . 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 H/O mammogram 02/02/2015   within normal limits.  . History of chemotherapy    Received 5-6 cycles adjuvant chemotherapy with Taxotere, Carboplatin, Herceptin ( last chemotherapy was on 07/21/09, patient discontinued Herceptin in December 2010).  . Osteoporosis 09/2012   seen on DEXA scan 2013  . Seizure disorder ([place])     PAST SURGICAL HISTORY :   Past Surgical History:  Procedure Laterality Date  . left craniotomy  2006  . MASTECTOMY Right   . MASTECTOMY WITH AXILLARY LYMPH NODE DISSECTION  January 30, 2009  . PARTIAL HYSTERECTOMY      FAMILY HISTORY :   Family History  Problem Relation Age of Onset  . Cancer Maternal Aunt        unknown primary  . Breast cancer Neg Hx   . Bladder Cancer Neg  Hx   . Kidney cancer Neg Hx     SOCIAL HISTORY:   Social History   Tobacco Use  . Smoking status: Never Smoker  . Smokeless tobacco: Never Used  Substance Use Topics  . Alcohol use: No    Alcohol/week: 0.0 oz  . Drug use: No    ALLERGIES:  is allergic to other and vicodin [hydrocodone-acetaminophen].  MEDICATIONS:  Current Outpatient Medications  Medication Sig Dispense Refill  . alendronate (FOSAMAX) 70 MG tablet Take 1 tablet by mouth once a week.     . Calcium Carbonate-Vitamin D 600-400 MG-UNIT tablet Take 1 tablet by mouth daily. 1    . Cyanocobalamin (VITAMIN B-12 PO) Take by mouth daily.    . Multiple Vitamins-Calcium (ONE-A-DAY WOMENS FORMULA PO) Take 1 tablet by mouth daily.    . naproxen (NAPROSYN) 500 MG tablet Take 1 tablet by mouth 3 (three) times daily with meals.     Marland Kitchen omeprazole (PRILOSEC) 20 MG capsule Take 20 mg by mouth daily.     . phenytoin (DILANTIN) 100 MG ER capsule Take 100 mg by mouth 3 (three) times daily.     No current facility-administered medications for this visit.     PHYSICAL EXAMINATION: ECOG PERFORMANCE STATUS: 0 - Asymptomatic  BP 124/78 (BP Location: Left Arm, Patient Position: Sitting)   Resp 16   Wt 119 lb 3.2 oz (54.1 kg)  BMI 21.80 kg/m   Filed Weights   12/04/17 1429  Weight: 119 lb 3.2 oz (54.1 kg)    GENERAL: Alert, no distress and comfortable.  She is alone. She is frail-appearing. Walks with arolling walker.  EYES: no pallor or icterus OROPHARYNX: no thrush or ulceration; good dentition  NECK: supple, no masses felt LYMPH:  no palpable lymphadenopathy in the cervical, axillary or inguinal regions LUNGS: clear to auscultation and  No wheeze or crackles HEART/CVS: regular rate & rhythm and no murmurs; No lower extremity edema ABDOMEN:abdomen soft, non-tender and normal bowel sounds Musculoskeletal:no cyanosis of digits and no clubbing  PSYCH: alert & oriented x 3 with fluent speech NEURO: no focal motor/sensory  deficits SKIN:  no rashes or significant lesions Right-mastectomy noted; no lumps or bumps noted in the incision.; Left side- breast within normal limits no skin changes or dominant masses felt.  LABORATORY DATA:  I have reviewed the data as listed    Component Value Date/Time   NA 135 11/16/2016 1347   NA 137 02/21/2015 0417   K 3.6 11/16/2016 1347   K 2.8 (L) 02/21/2015 0417   CL 102 11/16/2016 1347   CL 110 02/21/2015 0417   CO2 25 11/16/2016 1347   CO2 19 (L) 02/21/2015 0417   GLUCOSE 122 (H) 11/16/2016 1347   GLUCOSE 122 (H) 02/21/2015 0417   BUN 17 11/16/2016 1347   BUN 56 (H) 02/21/2015 0417   CREATININE 1.20 (H) 11/16/2016 1347   CREATININE 2.56 (H) 02/21/2015 0417   CALCIUM 9.5 11/16/2016 1347   CALCIUM 7.1 (L) 02/21/2015 0417   PROT 7.5 11/16/2016 1347   PROT 5.5 (L) 02/21/2015 0417   ALBUMIN 4.3 11/16/2016 1347   ALBUMIN 2.3 (L) 02/21/2015 0417   AST 27 11/16/2016 1347   AST 121 (H) 02/21/2015 0417   ALT 26 11/16/2016 1347   ALT 103 (H) 02/21/2015 0417   ALKPHOS 65 11/16/2016 1347   ALKPHOS 102 02/21/2015 0417   BILITOT 0.6 11/16/2016 1347   BILITOT 2.1 (H) 02/21/2015 0417   GFRNONAA 42 (L) 11/16/2016 1347   GFRNONAA 18 (L) 02/21/2015 0417   GFRAA 49 (L) 11/16/2016 1347   GFRAA 20 (L) 02/21/2015 0417    No results found for: SPEP, UPEP  Lab Results  Component Value Date   WBC 7.5 11/16/2016   NEUTROABS 4.8 11/16/2016   HGB 14.9 11/16/2016   HCT 43.5 11/16/2016   MCV 94.7 11/16/2016   PLT 229 11/16/2016      Chemistry      Component Value Date/Time   NA 135 11/16/2016 1347   NA 137 02/21/2015 0417   K 3.6 11/16/2016 1347   K 2.8 (L) 02/21/2015 0417   CL 102 11/16/2016 1347   CL 110 02/21/2015 0417   CO2 25 11/16/2016 1347   CO2 19 (L) 02/21/2015 0417   BUN 17 11/16/2016 1347   BUN 56 (H) 02/21/2015 0417   CREATININE 1.20 (H) 11/16/2016 1347   CREATININE 2.56 (H) 02/21/2015 0417      Component Value Date/Time   CALCIUM 9.5 11/16/2016  1347   CALCIUM 7.1 (L) 02/21/2015 0417   ALKPHOS 65 11/16/2016 1347   ALKPHOS 102 02/21/2015 0417   AST 27 11/16/2016 1347   AST 121 (H) 02/21/2015 0417   ALT 26 11/16/2016 1347   ALT 103 (H) 02/21/2015 0417   BILITOT 0.6 11/16/2016 1347   BILITOT 2.1 (H) 02/21/2015 0417       RADIOGRAPHIC STUDIES: I have personally reviewed  the radiological images as listed and agreed with the findings in the report. No results found.   ASSESSMENT & PLAN:  Carcinoma of overlapping sites of right breast in female, estrogen receptor positive ([place]) # Right-sided breast cancer stage II ER/PR positive HER-2/neu positive status post mastectomy followed by chemotherapy in 2010. Also finished aromatase inhibitor therapy in December 2015.  #  Clinically no concerns for recurrent disease at this time. Recent mammogram April 2018 of the left side negative.  # July 2018- Osteoporosis- on ca+vit D; on Fosomax; discussed regarding Prolia /subcu bisphosphonates.  Patient is reluctant. # CKD- creatinine- 1.2/stable.   # follow up in 12 months.   Cc; Dr.Johnston       Cammie Sickle, MD 12/05/2017 7:55 AM

## 2018-03-27 ENCOUNTER — Other Ambulatory Visit: Payer: Self-pay | Admitting: Internal Medicine

## 2018-03-27 DIAGNOSIS — Z1231 Encounter for screening mammogram for malignant neoplasm of breast: Secondary | ICD-10-CM

## 2018-05-14 ENCOUNTER — Ambulatory Visit
Admission: RE | Admit: 2018-05-14 | Discharge: 2018-05-14 | Disposition: A | Discharge status: Home / Self Care | Payer: Medicare Other | Source: Ambulatory Visit | Attending: Internal Medicine | Admitting: Internal Medicine

## 2018-05-14 DIAGNOSIS — Z1231 Encounter for screening mammogram for malignant neoplasm of breast: Secondary | ICD-10-CM | POA: Insufficient documentation

## 2018-06-10 ENCOUNTER — Other Ambulatory Visit: Payer: Self-pay

## 2018-06-10 ENCOUNTER — Emergency Department
Admission: EM | Admit: 2018-06-10 | Discharge: 2018-06-10 | Disposition: A | Discharge status: Home / Self Care | Payer: Medicare Other | Attending: Emergency Medicine | Admitting: Emergency Medicine

## 2018-06-10 DIAGNOSIS — Z853 Personal history of malignant neoplasm of breast: Secondary | ICD-10-CM | POA: Diagnosis not present

## 2018-06-10 DIAGNOSIS — N390 Urinary tract infection, site not specified: Secondary | ICD-10-CM | POA: Insufficient documentation

## 2018-06-10 DIAGNOSIS — Z79899 Other long term (current) drug therapy: Secondary | ICD-10-CM | POA: Diagnosis not present

## 2018-06-10 DIAGNOSIS — R300 Dysuria: Secondary | ICD-10-CM

## 2018-06-10 LAB — URINALYSIS, COMPLETE (UACMP) WITH MICROSCOPIC
Bilirubin Urine: NEGATIVE
Glucose, UA: NEGATIVE mg/dL
KETONES UR: NEGATIVE mg/dL
Nitrite: NEGATIVE
PROTEIN: 30 mg/dL — AB
Specific Gravity, Urine: 1.004 — ABNORMAL LOW (ref 1.005–1.030)
pH: 7 (ref 5.0–8.0)

## 2018-06-10 MED ORDER — CEPHALEXIN 500 MG PO CAPS
500.0000 mg | ORAL_CAPSULE | Freq: Once | ORAL | Status: AC
  Administered 2018-06-10: 500 mg via ORAL
  Filled 2018-06-10: qty 1

## 2018-06-10 MED ORDER — CEPHALEXIN 500 MG PO CAPS: 500.0000 mg | ORAL_CAPSULE | Freq: Three times a day (TID) | ORAL | 0 refills | Status: DC

## 2018-06-10 NOTE — ED Notes (Signed)
No peripheral IV placed this visit.   Discharge instructions reviewed with patient. Questions fielded by this RN. Patient verbalizes understanding of instructions. Patient discharged home in stable condition per [place]. No acute distress noted at time of discharge.   

## 2018-06-10 NOTE — Discharge Instructions (Addendum)
Please seek medical attention for any high fevers, chest pain, shortness of breath, change in behavior, persistent vomiting, bloody stool or any other new or concerning symptoms.  

## 2018-06-10 NOTE — ED Triage Notes (Signed)
Pt reports urinary frequency with burning since last night; denies fever at home; denies nausea or abd pain

## 2018-06-10 NOTE — ED Provider Notes (Signed)
[facility] Emergency Department Provider Note   ____________________________________________   I have reviewed the triage vital signs and the nursing notes.   HISTORY  Chief Complaint Dysuria   History limited by: Not Limited   HPI [name] is a 77 y.o. female who presents to the emergency department today because of concern for painful urination. The patient states that the symptoms started a few days ago. She has noticed that she has had increased frequency of urination. She denies any fevers. Had a little nausea at night but thought that was related to the TV dinner she ate. The patient does have a history of urinary tract infections.   Per medical record review patient has a history of urine culture which grew pan sensitive Ecoli.  Past Medical History:  Diagnosis Date  . Basal cell carcinoma of skin of face 02/2011  . Brain tumor ([place]) 2006  . Breast cancer ([place]) 2010   right mastectomy  . 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 H/O mammogram 02/02/2015   within normal limits.  . History of chemotherapy    Received 5-6 cycles adjuvant chemotherapy with Taxotere, Carboplatin, Herceptin ( last chemotherapy was on 07/21/09, patient discontinued Herceptin in December 2010).  . Osteoporosis 09/2012   seen on DEXA scan 2013  . Seizure disorder [facility])     Patient Active Problem List   Diagnosis Date Noted  . Carcinoma of overlapping sites of right breast in female, estrogen receptor positive ([place]) 11/16/2016  . 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 OP (osteoporosis) 04/07/2014    Past Surgical History:  Procedure Laterality Date  . left craniotomy  2006  . MASTECTOMY Right   . MASTECTOMY WITH AXILLARY LYMPH NODE DISSECTION  January 30, 2009  . PARTIAL HYSTERECTOMY      Prior to Admission medications   Medication Sig Start Date End Date Taking? Authorizing Provider   alendronate (FOSAMAX) 70 MG tablet Take 1 tablet by mouth once a week.  07/08/15   [provider]  Calcium Carbonate-Vitamin D 600-400 MG-UNIT tablet Take 1 tablet by mouth daily. 1    [provider]  Cyanocobalamin (VITAMIN B-12 PO) Take by mouth daily.    [provider]  Multiple Vitamins-Calcium (ONE-A-DAY WOMENS FORMULA PO) Take 1 tablet by mouth daily.    [provider]  naproxen (NAPROSYN) 500 MG tablet Take 1 tablet by mouth 3 (three) times daily with meals.  07/27/15   [provider]  omeprazole (PRILOSEC) 20 MG capsule Take 20 mg by mouth daily.  08/10/15   [provider]  phenytoin (DILANTIN) 100 MG ER capsule Take 100 mg by mouth 3 (three) times daily.    [provider]    Allergies Other and Vicodin [hydrocodone-acetaminophen]  Family History  Problem Relation Age of Onset  . Cancer Maternal Aunt        unknown primary  . Breast cancer Neg Hx   . Bladder Cancer Neg Hx   . Kidney cancer Neg Hx     Social History Social History   Tobacco Use  . Smoking status: Never Smoker  . Smokeless tobacco: Never Used  Substance Use Topics  . Alcohol use: No    Alcohol/week: 0.0 standard drinks  . Drug use: No    Review of Systems Constitutional: No fever/chills Eyes: No visual changes. ENT: No sore throat. Cardiovascular: Denies chest pain. Respiratory: Denies shortness of  breath. Gastrointestinal: No abdominal pain.  No nausea, no vomiting.  No diarrhea.   Genitourinary: Positive for dysuria.  Musculoskeletal: Negative for back pain. Skin: Negative for rash. Neurological: Negative for headaches, focal weakness or numbness.  ____________________________________________   PHYSICAL EXAM:  VITAL SIGNS: ED Triage Vitals [06/10/18 1942]  Enc Vitals Group     BP 136/74     Pulse Rate 84     Resp 18     Temp 97.8 F (36.6 C)     Temp Source Oral     SpO2 97 %   Constitutional: Alert and oriented.   Eyes: Conjunctivae are normal.  ENT      Head: Normocephalic and atraumatic.      Nose: No congestion/rhinnorhea.      Mouth/Throat: Mucous membranes are moist.      Neck: No stridor. Hematological/Lymphatic/Immunilogical: No cervical lymphadenopathy. Cardiovascular: Normal rate, regular rhythm.  No murmurs, rubs, or gallops. Respiratory: Normal respiratory effort without tachypnea nor retractions. Breath sounds are clear and equal bilaterally. No wheezes/rales/rhonchi. Gastrointestinal: Soft and non tender. No rebound. No guarding.  Genitourinary: Deferred Musculoskeletal: Normal range of motion in all extremities. No lower extremity edema. Neurologic:  Normal speech and language. No gross focal neurologic deficits are appreciated.  Skin:  Skin is warm, dry and intact. No rash noted. Psychiatric: Mood and affect are normal. Speech and behavior are normal. Patient exhibits appropriate insight and judgment.  ____________________________________________    LABS (pertinent positives/negatives)  UA Cloudy, small urine dipstick, protein 30, large leukocytes, >50 wbcs, few bacteria  ____________________________________________   EKG  None  ____________________________________________    RADIOLOGY  None  ____________________________________________   PROCEDURES  Procedures  ____________________________________________   INITIAL IMPRESSION / ASSESSMENT AND PLAN / ED COURSE  Pertinent labs & imaging results that were available during my care of the patient were reviewed by me and considered in my medical decision making (see chart for details).   Patient presented because of concern for dysuria. UA is consistent with UTI. Did offer IV fluids and abx which patient declined. Will give first dose of oral antibiotics here.   ______________________________________   FINAL CLINICAL IMPRESSION(S) / ED DIAGNOSES  Final diagnoses:  Lower urinary tract infectious disease   Dysuria     Note: This dictation was prepared with Dragon dictation. Any transcriptional errors that result from this process are unintentional     Nance Pear, MD 06/10/18 2128

## 2018-06-12 LAB — URINE CULTURE: Culture: <10000 — AB

## 2018-11-22 ENCOUNTER — Emergency Department: Payer: Medicare Other

## 2018-11-22 ENCOUNTER — Observation Stay
Admission: EM | Admit: 2018-11-22 | Discharge: 2018-11-24 | Disposition: A | Discharge status: Home / Self Care | Payer: Medicare Other | Attending: Internal Medicine | Admitting: Internal Medicine

## 2018-11-22 ENCOUNTER — Other Ambulatory Visit: Payer: Self-pay

## 2018-11-22 DIAGNOSIS — G40409 Other generalized epilepsy and epileptic syndromes, not intractable, without status epilepticus: Secondary | ICD-10-CM | POA: Diagnosis not present

## 2018-11-22 DIAGNOSIS — R4182 Altered mental status, unspecified: Principal | ICD-10-CM

## 2018-11-22 DIAGNOSIS — Z79899 Other long term (current) drug therapy: Secondary | ICD-10-CM | POA: Insufficient documentation

## 2018-11-22 DIAGNOSIS — R531 Weakness: Secondary | ICD-10-CM | POA: Diagnosis not present

## 2018-11-22 DIAGNOSIS — G9340 Encephalopathy, unspecified: Secondary | ICD-10-CM | POA: Insufficient documentation

## 2018-11-22 DIAGNOSIS — Z85828 Personal history of other malignant neoplasm of skin: Secondary | ICD-10-CM | POA: Insufficient documentation

## 2018-11-22 DIAGNOSIS — Z853 Personal history of malignant neoplasm of breast: Secondary | ICD-10-CM | POA: Insufficient documentation

## 2018-11-22 DIAGNOSIS — K219 Gastro-esophageal reflux disease without esophagitis: Secondary | ICD-10-CM | POA: Diagnosis not present

## 2018-11-22 DIAGNOSIS — Z791 Long term (current) use of non-steroidal anti-inflammatories (NSAID): Secondary | ICD-10-CM | POA: Insufficient documentation

## 2018-11-22 LAB — CBC WITH DIFFERENTIAL/PLATELET
Abs Immature Granulocytes: 0.01 10*3/uL (ref 0.00–0.07)
BASOS ABS: 0 10*3/uL (ref 0.0–0.1)
Basophils Relative: 1 %
EOS ABS: 0.8 10*3/uL — AB (ref 0.0–0.5)
EOS PCT: 14 %
HCT: 43.8 % (ref 36.0–46.0)
Hemoglobin: 14.7 g/dL (ref 12.0–15.0)
Immature Granulocytes: 0 %
LYMPHS PCT: 20 %
Lymphs Abs: 1.2 10*3/uL (ref 0.7–4.0)
MCH: 32 pg (ref 26.0–34.0)
MCHC: 33.6 g/dL (ref 30.0–36.0)
MCV: 95.2 fL (ref 80.0–100.0)
Monocytes Absolute: 0.7 10*3/uL (ref 0.1–1.0)
Monocytes Relative: 11 %
NRBC: 0 % (ref 0.0–0.2)
Neutro Abs: 3.2 10*3/uL (ref 1.7–7.7)
Neutrophils Relative %: 54 %
PLATELETS: 218 10*3/uL (ref 150–400)
RBC: 4.6 MIL/uL (ref 3.87–5.11)
RDW: 12.5 % (ref 11.5–15.5)
WBC: 5.9 10*3/uL (ref 4.0–10.5)

## 2018-11-22 LAB — URINALYSIS, COMPLETE (UACMP) WITH MICROSCOPIC
Bacteria, UA: NONE SEEN
Bilirubin Urine: NEGATIVE
GLUCOSE, UA: NEGATIVE mg/dL
Hgb urine dipstick: NEGATIVE
KETONES UR: NEGATIVE mg/dL
LEUKOCYTES UA: NEGATIVE
Nitrite: NEGATIVE
PH: 7 (ref 5.0–8.0)
PROTEIN: NEGATIVE mg/dL
Specific Gravity, Urine: 1.004 — ABNORMAL LOW (ref 1.005–1.030)

## 2018-11-22 LAB — TROPONIN I

## 2018-11-22 LAB — COMPREHENSIVE METABOLIC PANEL
ALT: 23 U/L (ref 0–44)
AST: 29 U/L (ref 15–41)
Albumin: 4.1 g/dL (ref 3.5–5.0)
Alkaline Phosphatase: 67 U/L (ref 38–126)
Anion gap: 8 (ref 5–15)
BUN: 12 mg/dL (ref 8–23)
CHLORIDE: 102 mmol/L (ref 98–111)
CO2: 27 mmol/L (ref 22–32)
Calcium: 9.3 mg/dL (ref 8.9–10.3)
Creatinine, Ser: 0.98 mg/dL (ref 0.44–1.00)
GFR, EST NON AFRICAN AMERICAN: 55 mL/min — AB (ref >60)
Glucose, Bld: 104 mg/dL — ABNORMAL HIGH (ref 70–99)
POTASSIUM: 3.9 mmol/L (ref 3.5–5.1)
SODIUM: 137 mmol/L (ref 135–145)
Total Bilirubin: 0.7 mg/dL (ref 0.3–1.2)
Total Protein: 6.6 g/dL (ref 6.5–8.1)

## 2018-11-22 LAB — ACETAMINOPHEN LEVEL: Acetaminophen (Tylenol), Serum: <10 ug/mL — ABNORMAL LOW (ref 10–30)

## 2018-11-22 LAB — URINE DRUG SCREEN, QUALITATIVE (ARMC ONLY)
Amphetamines, Ur Screen: NOT DETECTED
BARBITURATES, UR SCREEN: NOT DETECTED
BENZODIAZEPINE, UR SCRN: NOT DETECTED
CANNABINOID 50 NG, UR ~~LOC~~: NOT DETECTED
Cocaine Metabolite,Ur ~~LOC~~: NOT DETECTED
MDMA (Ecstasy)Ur Screen: NOT DETECTED
METHADONE SCREEN, URINE: NOT DETECTED
Opiate, Ur Screen: NOT DETECTED
Phencyclidine (PCP) Ur S: NOT DETECTED
TRICYCLIC, UR SCREEN: NOT DETECTED

## 2018-11-22 LAB — INFLUENZA PANEL BY PCR (TYPE A & B)
INFLBPCR: NEGATIVE
Influenza A By PCR: NEGATIVE

## 2018-11-22 LAB — LIPASE, BLOOD: LIPASE: 37 U/L (ref 11–51)

## 2018-11-22 LAB — ETHANOL

## 2018-11-22 LAB — SALICYLATE LEVEL

## 2018-11-22 LAB — CK: Total CK: 55 U/L (ref 38–234)

## 2018-11-22 LAB — TSH: TSH: 2.971 u[IU]/mL (ref 0.350–4.500)

## 2018-11-22 LAB — AMMONIA

## 2018-11-22 MED ORDER — LORAZEPAM 2 MG/ML IJ SOLN
1.0000 mg | Freq: Once | INTRAMUSCULAR | Status: AC
  Administered 2018-11-22: 1 mg via INTRAVENOUS

## 2018-11-22 MED ORDER — LORAZEPAM 2 MG/ML IJ SOLN
INTRAMUSCULAR | Status: AC
  Administered 2018-11-22: 1 mg via INTRAVENOUS
  Filled 2018-11-22: qty 1

## 2018-11-22 NOTE — ED Notes (Signed)
Daughter states patient lives by herself in an apartment. She states she is worried about patient living by herself and not sure if she can take care of herself.

## 2018-11-22 NOTE — ED Triage Notes (Signed)
Pt arrives from home via [facility]. Pt lives alone. EMS reports that family found pt lethargic and altered at home. EMS reports unsure last known well. Doesn't think pt fell. Thinks pt took too much of her medication but pt had removed labels from bottles so unsure what pt takes. EMS gave 1.5 intranasal narcan and pt became agitated. Pt is not talking upon arrival but moving all extremities. Prior to narcan pt oxygen was 80% on RA. After narcan pt oxygen came up to 94%. CBG 119. HR in 80's, BP 130/74.

## 2018-11-22 NOTE — ED Notes (Signed)
Family in with patient

## 2018-11-22 NOTE — ED Notes (Signed)
EDP in with patient to speak with family.

## 2018-11-22 NOTE — ED Provider Notes (Signed)
[facility] Emergency Department Provider Note  ____________________________________________   First MD Initiated Contact with Patient 11/22/18 1858     (approximate)  I have reviewed the triage vital signs and the nursing notes.   HISTORY  Chief Complaint Altered Mental Status   HPI [name] is a 78 y.o. female with a history of brain tumor as well as breast cancer and seizure disorder who is presented emergency department today with altered mental status.  EMS reports they do not have a last known normal and the patient but she was found in her home, where she lives alone, with a decreased mentation, hypoxic to the 80s.  She was given 1.5 mg of intranasal Narcan and now is agitated.  Has not been verbal during the transport.  No focal weakness.   Past Medical History:  Diagnosis Date  . Basal cell carcinoma of skin of face 02/2011  . Brain tumor ([place]) 2006  . Breast cancer ([place]) 2010   right mastectomy  . 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 H/O mammogram 02/02/2015   within normal limits.  . History of chemotherapy    Received 5-6 cycles adjuvant chemotherapy with Taxotere, Carboplatin, Herceptin ( last chemotherapy was on 07/21/09, patient discontinued Herceptin in December 2010).  . Osteoporosis 09/2012   seen on DEXA scan 2013  . Seizure disorder [facility])     Patient Active Problem List   Diagnosis Date Noted  . Carcinoma of overlapping sites of right breast in female, estrogen receptor positive ([place]) 11/16/2016  . 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 OP (osteoporosis) 04/07/2014    Past Surgical History:  Procedure Laterality Date  . left craniotomy  2006  . MASTECTOMY Right   . MASTECTOMY WITH AXILLARY LYMPH NODE DISSECTION  January 30, 2009  . PARTIAL HYSTERECTOMY      Prior to Admission medications   Medication Sig Start Date End Date Taking? Authorizing  Provider  alendronate (FOSAMAX) 70 MG tablet Take 1 tablet by mouth once a week.  07/08/15   [provider]  Calcium Carbonate-Vitamin D 600-400 MG-UNIT tablet Take 1 tablet by mouth daily. 1    [provider]  cephALEXin (KEFLEX) 500 MG capsule Take 1 capsule (500 mg total) by mouth 3 (three) times daily. 06/10/18   Nance Pear, MD  Cyanocobalamin (VITAMIN B-12 PO) Take by mouth daily.    [provider]  Multiple Vitamins-Calcium (ONE-A-DAY WOMENS FORMULA PO) Take 1 tablet by mouth daily.    [provider]  naproxen (NAPROSYN) 500 MG tablet Take 1 tablet by mouth 3 (three) times daily with meals.  07/27/15   [provider]  omeprazole (PRILOSEC) 20 MG capsule Take 20 mg by mouth daily.  08/10/15   [provider]  phenytoin (DILANTIN) 100 MG ER capsule Take 100 mg by mouth 3 (three) times daily.    [provider]    Allergies Other and Vicodin [hydrocodone-acetaminophen]  Family History  Problem Relation Age of Onset  . Cancer Maternal Aunt        unknown primary  . Breast cancer Neg Hx   . Bladder Cancer Neg Hx   . Kidney cancer Neg Hx     Social History Social History   Tobacco Use  . Smoking status: Never Smoker  . Smokeless tobacco: Never Used  Substance Use Topics  . Alcohol use: No    Alcohol/week: 0.0  standard drinks  . Drug use: No    Review of Systems  Level 5 caveat secondary to altered mentation.  ____________________________________________   PHYSICAL EXAM:  VITAL SIGNS: ED Triage Vitals [11/22/18 1900]  Enc Vitals Group     BP      Pulse      Resp      Temp      Temp src      SpO2      Weight 115 lb (52.2 kg)     Height '5\' 3"'  (1.6 m)     Head Circumference      Peak Flow      Pain Score      Pain Loc      Pain Edu?      Excl. in [place]?     Constitutional: Alert alert but nonverbal.  Slightly combative during the exam.  Resisting palpation. Eyes: Conjunctivae are normal.    Head: Atraumatic. Nose: No congestion/rhinnorhea. Mouth/Throat: Mucous membranes are moist.  Neck: No stridor.   Cardiovascular: Normal rate, regular rhythm. Grossly normal heart sounds. Respiratory: Normal respiratory effort.  No retractions. Lungs CTAB. Gastrointestinal: Soft and nontender. No distention.  Musculoskeletal: No lower extremity tenderness nor edema.  No joint effusions. Neurologic:   No gross focal neurologic deficits are appreciated. Skin:  Skin is warm, dry and intact. No rash noted. Psychiatric: Agitated  ____________________________________________   LABS (all labs ordered are listed, but only abnormal results are displayed)  Labs Reviewed  CBC WITH DIFFERENTIAL/PLATELET - Abnormal; Notable for the following components:      Result Value   Eosinophils Absolute 0.8 (*)    All other components within normal limits  COMPREHENSIVE METABOLIC PANEL - Abnormal; Notable for the following components:   Glucose, Bld 104 (*)    GFR calc non Af Amer 55 (*)    All other components within normal limits  AMMONIA - Abnormal; Notable for the following components:   Ammonia <9 (*)    All other components within normal limits  ACETAMINOPHEN LEVEL - Abnormal; Notable for the following components:   Acetaminophen (Tylenol), Serum <10 (*)    All other components within normal limits  URINALYSIS, COMPLETE (UACMP) WITH MICROSCOPIC - Abnormal; Notable for the following components:   Color, Urine YELLOW (*)    APPearance CLEAR (*)    Specific Gravity, Urine 1.004 (*)    All other components within normal limits  TROPONIN I  LIPASE, BLOOD  TSH  URINE DRUG SCREEN, QUALITATIVE ([facility] ONLY)  SALICYLATE LEVEL  ETHANOL  INFLUENZA PANEL BY PCR (TYPE A & B)   ____________________________________________  EKG  ED ECG REPORT I, Doran Stabler, the attending physician, personally viewed and interpreted this ECG.   Date: 11/22/2018  EKG Time: 1907  Rate: 80  Rhythm: normal  sinus rhythm  Axis: Normal  Intervals:left bundle branch block  ST&T Change: ST segment elevations in V3 consistent with left bundle branch block.  Minimal depression in V5 and V6 with T wave inversions in V5, V6, aVL as well as 1. Similar morphology seen on EKG from 2016. ____________________________________________  RADIOLOGY  Chest x-ray without acute process.  Patient with head CT without acute process. ____________________________________________   PROCEDURES  Procedure(s) performed:   Procedures  Critical Care performed:   ____________________________________________   INITIAL IMPRESSION / ASSESSMENT AND PLAN / ED COURSE  Pertinent labs & imaging results that were available during my care of the patient were reviewed by me and considered in my medical  decision making (see chart for details).  Differential diagnosis includes, but is not limited to, alcohol, illicit or prescription medications, or other toxic ingestion; intracranial pathology such as stroke or intracerebral hemorrhage; fever or infectious causes including sepsis; hypoxemia and/or hypercarbia; uremia; trauma; endocrine related disorders such as diabetes, hypoglycemia, and thyroid-related diseases; hypertensive encephalopathy; etc. As part of my medical decision making, I reviewed the following data within the electronic MEDICAL RECORD NUMBER Notes from prior ED visits  ----------------------------------------- 8:29 PM on 11/22/2018 -----------------------------------------  Patient's daughter now at the bedside and states that the patient was last normal approximately 24 hours ago.  However, 24 hours ago she reports that the patient was complaining of upper respiratory symptoms.  Daughters are unsure of what medications the patient takes or what she has taken in the past 24 hours to treat her cold symptoms.    ----------------------------------------- 9:49 PM on  11/22/2018 -----------------------------------------  Patient at this time with improved mental status.  Says that she is in the hospital.  Family still the bedside and says that she is not back to baseline mental status.  So far reassuring work-up with steady improvement in patient's mentation.  No need to re-dose Narcan at this time.  However, because patient is not back to her baseline mental status she will be admitted to the hospital for undifferentiated altered mental status.  Patient as well as family understand the diagnosis well treatment and willing to comply.  Signed out to Dr. Anselm Jungling. ____________________________________________   FINAL CLINICAL IMPRESSION(S) / ED DIAGNOSES  Altered mental status  NEW MEDICATIONS STARTED DURING THIS VISIT:  New Prescriptions   No medications on file     Note:  This document was prepared using Dragon voice recognition software and may include unintentional dictation errors.     Orbie Pyo, MD 11/22/18 2150

## 2018-11-23 ENCOUNTER — Observation Stay: Payer: Medicare Other

## 2018-11-23 ENCOUNTER — Other Ambulatory Visit: Payer: Self-pay

## 2018-11-23 DIAGNOSIS — R4182 Altered mental status, unspecified: Secondary | ICD-10-CM | POA: Diagnosis not present

## 2018-11-23 LAB — BASIC METABOLIC PANEL
ANION GAP: 7 (ref 5–15)
BUN: 12 mg/dL (ref 8–23)
CO2: 28 mmol/L (ref 22–32)
Calcium: 9 mg/dL (ref 8.9–10.3)
Chloride: 105 mmol/L (ref 98–111)
Creatinine, Ser: 0.94 mg/dL (ref 0.44–1.00)
GFR calc Af Amer: >60 mL/min (ref >60)
GFR calc non Af Amer: 58 mL/min — ABNORMAL LOW (ref >60)
Glucose, Bld: 89 mg/dL (ref 70–99)
Potassium: 3.5 mmol/L (ref 3.5–5.1)
Sodium: 140 mmol/L (ref 135–145)

## 2018-11-23 LAB — CBC
HCT: 42.5 % (ref 36.0–46.0)
Hemoglobin: 14.3 g/dL (ref 12.0–15.0)
MCH: 32.5 pg (ref 26.0–34.0)
MCHC: 33.6 g/dL (ref 30.0–36.0)
MCV: 96.6 fL (ref 80.0–100.0)
PLATELETS: 235 10*3/uL (ref 150–400)
RBC: 4.4 MIL/uL (ref 3.87–5.11)
RDW: 12.3 % (ref 11.5–15.5)
WBC: 6.4 10*3/uL (ref 4.0–10.5)
nRBC: 0 % (ref 0.0–0.2)

## 2018-11-23 LAB — PHENYTOIN LEVEL, TOTAL: Phenytoin Lvl: 13 ug/mL (ref 10.0–20.0)

## 2018-11-23 MED ORDER — NAPROXEN 500 MG PO TABS
500.0000 mg | ORAL_TABLET | Freq: Three times a day (TID) | ORAL | Status: DC
  Administered 2018-11-23 – 2018-11-24 (×3): 500 mg via ORAL
  Filled 2018-11-23 (×6): qty 1

## 2018-11-23 MED ORDER — VITAMIN B-12 1000 MCG PO TABS
500.0000 ug | ORAL_TABLET | Freq: Every day | ORAL | Status: DC
  Administered 2018-11-23 – 2018-11-24 (×2): 500 ug via ORAL
  Filled 2018-11-23 (×3): qty 1

## 2018-11-23 MED ORDER — PANTOPRAZOLE SODIUM 40 MG PO TBEC
40.0000 mg | DELAYED_RELEASE_TABLET | Freq: Every day | ORAL | Status: DC
  Administered 2018-11-23 – 2018-11-24 (×2): 40 mg via ORAL
  Filled 2018-11-23 (×2): qty 1

## 2018-11-23 MED ORDER — CALCIUM CARBONATE-VITAMIN D 500-200 MG-UNIT PO TABS
1.0000 | ORAL_TABLET | Freq: Every day | ORAL | Status: DC
  Administered 2018-11-23 – 2018-11-24 (×2): 1 via ORAL
  Filled 2018-11-23 (×2): qty 1

## 2018-11-23 MED ORDER — HEPARIN SODIUM (PORCINE) 5000 UNIT/ML IJ SOLN
5000.0000 [IU] | Freq: Three times a day (TID) | INTRAMUSCULAR | Status: DC
  Administered 2018-11-23 (×3): 5000 [IU] via SUBCUTANEOUS
  Filled 2018-11-23 (×3): qty 1

## 2018-11-23 MED ORDER — DOCUSATE SODIUM 100 MG PO CAPS: 100.0000 mg | ORAL_CAPSULE | Freq: Two times a day (BID) | ORAL | Status: DC | PRN

## 2018-11-23 MED ORDER — LAMOTRIGINE 25 MG PO TABS
25.0000 mg | ORAL_TABLET | Freq: Two times a day (BID) | ORAL | Status: DC
  Administered 2018-11-23 – 2018-11-24 (×2): 25 mg via ORAL
  Filled 2018-11-23 (×2): qty 1

## 2018-11-23 MED ORDER — PHENYTOIN SODIUM EXTENDED 100 MG PO CAPS
100.0000 mg | ORAL_CAPSULE | Freq: Three times a day (TID) | ORAL | Status: DC
  Administered 2018-11-23 – 2018-11-24 (×3): 100 mg via ORAL
  Filled 2018-11-23 (×7): qty 1

## 2018-11-23 NOTE — Evaluation (Signed)
Physical Therapy Evaluation Patient Details Name: [name] MRN: [id number] DOB: 12/13/1940 Today's Date: 11/23/2018   History of Present Illness  [name] is a 77yo female who comes to [facility] on 1/30 after found unresponsive by daughter. head CT/MRI negative, Neurology considering seizure as postential contributing factor. PMH: brain tumor s/p resection, BrCA, seizure d/o, osteoporosis. PTA pt lives alone in apartment, manages he rown medications, DTR assists with groceries, meals, transportation.    Clinical Impression  Pt admitted with above diagnosis. Pt currently with functional limitations due to the deficits listed below (see "PT Problem List"). Upon entry, pt on BSC with CNA in room, no family/caregiver present but DTR calls at end of session, speaks to author on phone prior to exit.  The pt is awake and agreeable to participate. The pt is alert and oriented x3, pleasant, conversational, and following simple commands consistently. Pt does require min additional time to respond to questioning, and minimally slurred speech, unclear if this is baseline. Functional mobility assessment demonstrates increased effort/time requirements, limited tolerance, and minimal need for physical assistance, however patient is more independent when allowed to move in familiar patterns/equip set-up. Per patient, she denies any acute weakness. Pt will benefit from skilled PT intervention to increase independence and safety with basic mobility in preparation for discharge to the venue listed below.       Follow Up Recommendations Home health PT;Supervision for mobility/OOB    Equipment Recommendations  None recommended by PT    Recommendations for Other Services       Precautions / Restrictions Precautions Precautions: Fall Precaution Comments: ?seizure  Restrictions Weight Bearing Restrictions: No      Mobility  Bed Mobility               General bed mobility comments: recevied on BSC c  CNA   Transfers Overall transfer level: Needs assistance Equipment used: Rolling walker (2 wheeled) Transfers: Stand Pivot Transfers;Sit to/from Stand Sit to Stand: Min guard(fleet slipping 2/2 quads dominance ) Stand pivot transfers: Min guard(no AD available, CNA giving physical assist)          Ambulation/Gait Ambulation/Gait assistance: (deferred at this time 2/2 weakness/RN in room with meds)              Stairs            Wheelchair Mobility    Modified Rankin (Stroke Patients Only)       Balance Overall balance assessment: History of Falls;Needs assistance Sitting-balance support: No upper extremity supported;Feet supported Sitting balance-Leahy Scale: Good     Standing balance support: During functional activity;Single extremity supported Standing balance-Leahy Scale: Fair                               Pertinent Vitals/Pain Pain Assessment: No/denies pain    Home Living Family/patient expects to be discharged to:: Private residence Living Arrangements: Alone Available Help at Discharge: Family(DTR assists with IADL as baseline ) Type of Home: Apartment Home Access: Level entry     Home Layout: One level Home Equipment: Walker - 4 wheels;Shower seat - built in      Prior Function Level of Independence: Needs assistance   Gait / Transfers Assistance Needed: mod-I transfers, limited household distance AMB modI c 4WW   ADL's / Homemaking Assistance Needed: independent in basic ADL, DTR assists with transport, meals, groceries.         Hand Dominance          Extremity/Trunk Assessment   Upper Extremity Assessment Upper Extremity Assessment: Generalized weakness    Lower Extremity Assessment Lower Extremity Assessment: Generalized weakness    Cervical / Trunk Assessment Cervical / Trunk Assessment: Kyphotic  Communication   Communication: No difficulties  Cognition Arousal/Alertness: Awake/alert   Overall  Cognitive Status: Difficult to assess                                        General Comments      Exercises     Assessment/Plan    PT Assessment Patient needs continued PT services  PT Problem List Decreased strength;Decreased activity tolerance;Decreased mobility;Decreased balance       PT Treatment Interventions DME instruction;Gait training;Therapeutic exercise;Balance training;Functional mobility training;Patient/family education    PT Goals (Current goals can be found in the Care Plan section)  Acute Rehab PT Goals Patient Stated Goal: return to home PT Goal Formulation: With patient Time For Goal Achievement: 12/07/18 Potential to Achieve Goals: Good    Frequency Min 2X/week   Barriers to discharge Decreased caregiver support pt lives alone, 2 near-falls recently.     Co-evaluation               AM-PAC PT "6 Clicks" Mobility  Outcome Measure Help needed turning from your back to your side while in a flat bed without using bedrails?: A Little Help needed moving from lying on your back to sitting on the side of a flat bed without using bedrails?: A Little Help needed moving to and from a bed to a chair (including a wheelchair)?: A Little Help needed standing up from a chair using your arms (e.g., wheelchair or bedside chair)?: A Little Help needed to walk in hospital room?: A Little   6 Click Score: 15    End of Session   Activity Tolerance: Patient tolerated treatment well Patient left: in bed;with nursing/sitter in room Nurse Communication: Mobility status;Other (comment)(discussed cognition ) PT Visit Diagnosis: Unsteadiness on feet (R26.81);Other abnormalities of gait and mobility (R26.89);Difficulty in walking, not elsewhere classified (R26.2);Muscle weakness (generalized) (M62.81)    Time: 1341-1351 PT Time Calculation (min) (ACUTE ONLY): 10 min   Charges:   PT Evaluation $PT Eval Moderate Complexity: 1 Mod          2:25  PM, 11/23/18 Allan C Buccola, PT, DPT Physical Therapist - [facility]  [phone] (ASCOM)     Buccola,Allan C 11/23/2018, 2:22 PM   

## 2018-11-23 NOTE — Care Management Obs Status (Signed)
[facility] NOTIFICATION   Patient Details  Name: [name] MRN: [id number] Date of Birth: September 23, 1941   Medicare Observation Status Notification Given:  Yes    [name] A Mossie Gilder, RN 11/23/2018, 9:08 AM

## 2018-11-23 NOTE — Care Management Note (Signed)
Case Management Note  Patient Details  Name: [name] MRN: [id number] Date of Birth: Jul 16, 1941  Subjective/Objective:   Patient admitted to [facility] under observation status for altered mental status. RNCM consulted on patient to provide MOON letter and complete assessment. Patient currently lives alone. She is independent at baseline and uses a walker. Patient uses [employer] for transportation. Patient reports difficulty with household activities and consistent transportation. I have provided a list of community resources that she may look into. Patient has a daughter in the area who is able to assist at times but per patient she has a busy schedule and is not always available. PCP  Edwina Barth. Uses [facility] in [place] and obtains medications without issue.                    Action/Plan:   Expected Discharge Date:                  Expected Discharge Plan:     In-House Referral:     Discharge planning Services     Post Acute Care Choice:    Choice offered to:     DME Arranged:    DME Agency:     HH Arranged:    HH Agency:     Status of Service:     If discussed at [employer] of Stay Meetings, dates discussed:    Additional Comments:  Kathyrn Drown Sidnee Gambrill, RN 11/23/2018, 9:12 AM

## 2018-11-23 NOTE — Progress Notes (Signed)
[name] at [facility]                                                                                                                                                                                  Patient Demographics   [name], is a 78 y.o. female, DOB - August 26, 1941, YHC:[id number]  Admit date - 11/22/2018   Admitting Physician Vaughan Basta, MD  Outpatient Primary MD for the patient is Baxter Hire, MD   LOS - 0  Subjective: Patient with a history of seizures presented with states that she has a history of seizure   Review of Systems:   CONSTITUTIONAL: No documented fever. No fatigue, weakness. No weight gain, no weight loss.  EYES: No blurry or double vision.  ENT: No tinnitus. No postnasal drip. No redness of the oropharynx.  RESPIRATORY: No cough, no wheeze, no hemoptysis. No dyspnea.  CARDIOVASCULAR: No chest pain. No orthopnea. No palpitations. No syncope.  GASTROINTESTINAL: No nausea, no vomiting or diarrhea. No abdominal pain. No melena or hematochezia.  GENITOURINARY: No dysuria or hematuria.  ENDOCRINE: No polyuria or nocturia. No heat or cold intolerance.  HEMATOLOGY: No anemia. No bruising. No bleeding.  INTEGUMENTARY: No rashes. No lesions.  MUSCULOSKELETAL: No arthritis. No swelling. No gout.  NEUROLOGIC: No numbness, tingling, or ataxia. No seizure-type activity.  PSYCHIATRIC: No anxiety. No insomnia. No ADD.    Vitals:   Vitals:   11/23/18 0030 11/23/18 0059 11/23/18 0430 11/23/18 0500  BP: 130/78 118/62 132/74   Pulse: 75 72 71   Resp: 16 16 17    Temp:  (!) 97.4 F (36.3 C) (!) 97.5 F (36.4 C)   TempSrc:  Oral Oral   SpO2: 99% 99% 96%   Weight:    48.7 kg  Height:    5\' 3"  (1.6 m)    Wt Readings from Last 3 Encounters:  11/23/18 48.7 kg  12/04/17 54.1 kg  10/30/17 56.1 kg     Intake/Output Summary (Last 24 hours) at 11/23/2018 1121 Last data filed at 11/23/2018 5176 Gross per 24 hour  Intake -   Output 201 ml  Net -201 ml    Physical Exam:   GENERAL: Pleasant-appearing in no apparent distress.  HEAD, EYES, EARS, NOSE AND THROAT: Atraumatic, normocephalic. Extraocular muscles are intact. Pupils equal and reactive to light. Sclerae anicteric. No conjunctival injection. No oro-pharyngeal erythema.  NECK: Supple. There is no jugular venous distention. No bruits, no lymphadenopathy, no thyromegaly.  HEART: Regular rate and rhythm,. No murmurs, no rubs, no clicks.  LUNGS: Clear to auscultation bilaterally. No rales or rhonchi. No wheezes.  ABDOMEN: Soft, flat, nontender, nondistended. Has good bowel sounds.  No hepatosplenomegaly appreciated.  EXTREMITIES: No evidence of any cyanosis, clubbing, or peripheral edema.  +2 pedal and radial pulses bilaterally.  NEUROLOGIC: The patient is alert, awake, and oriented x3 with no focal motor or sensory deficits appreciated bilaterally.  SKIN: Moist and warm with no rashes appreciated.  Psych: Not anxious, depressed LN: No inguinal LN enlargement    Antibiotics   Anti-infectives (From admission, onward)   None      Medications   Scheduled Meds: . calcium-vitamin D  1 tablet Oral Daily  . heparin  5,000 Units Subcutaneous Q8H  . naproxen  500 mg Oral TID WC  . pantoprazole  40 mg Oral Daily  . phenytoin  100 mg Oral TID  . vitamin B-12  500 mcg Oral Daily   Continuous Infusions: PRN Meds:.docusate sodium   Data Review:   Micro Results No results found for this or any previous visit (from the past 240 hour(s)).  Radiology Reports Dg Chest 1 View  Result Date: 11/22/2018 CLINICAL DATA:  Patient is lethargic. EXAM: CHEST  1 VIEW COMPARISON:  April 02, 2009 FINDINGS: The heart size and mediastinal contours are within normal limits. Both lungs are clear. The visualized skeletal structures are unremarkable. IMPRESSION: No active disease. Electronically Signed   By: Dorise Bullion III M.D   On: 11/22/2018 20:25   Ct Head Wo  Contrast  Result Date: 11/22/2018 CLINICAL DATA:  78 year old female with altered mental status. EXAM: CT HEAD WITHOUT CONTRAST TECHNIQUE: Contiguous axial images were obtained from the base of the skull through the vertex without intravenous contrast. COMPARISON:  08/17/2006 CT FINDINGS: Brain: No evidence of acute infarction, hemorrhage, hydrocephalus, extra-axial collection or mass lesion/mass effect. Mild chronic small-vessel white matter ischemic changes, small area of LEFT frontoparietal encephalomalacia and heavily calcified meningiomas along the LATERAL LEFT frontotemporal region again noted. Vascular: Mild carotid atherosclerotic calcifications noted. Skull: No acute abnormality. LEFT parietal craniotomy changes again noted. Sinuses/Orbits: No acute abnormality Other: None IMPRESSION: 1. No evidence of acute intracranial abnormality 2. Mild chronic small-vessel white matter ischemic changes. Electronically Signed   By: Margarette Canada M.D.   On: 11/22/2018 20:56   Mr Brain Wo Contrast  Result Date: 11/23/2018 CLINICAL DATA:  Encephalopathy. History of meningioma resection, breast cancer, and seizure disorder. EXAM: MRI HEAD WITHOUT CONTRAST TECHNIQUE: Multiplanar, multiecho pulse sequences of the brain and surrounding structures were obtained without intravenous contrast. COMPARISON:  Head CT 11/22/2018 FINDINGS: Brain: No acute infarct, midline shift, or extra-axial fluid collection is evident. Mild cerebral atrophy is within normal limits for age. There are numerous chronic microhemorrhages throughout the predominantly peripheral aspects of both cerebral hemispheres. A single chronic microhemorrhage is noted in the right thalamus, and there are numerous chronic microhemorrhages in the cerebellum. Small foci of T2 hyperintensity scattered throughout the cerebral white matter bilaterally are nonspecific but compatible with mild-to-moderate chronic small vessel ischemic disease. A small focus of cortical  encephalomalacia is again noted in the lateral left parietal lobe and may be postoperative. Two foci of dense extra-axial calcification measuring up to 9 mm along the inner table of the skull in the left frontotemporal region may represent small meningiomas though postcontrast MRI would better assess. Regardless, there is no significant associated mass effect or edema. Vascular: Major intracranial vascular flow voids are preserved. Skull and upper cervical spine: Left parietal craniotomy. Sinuses/Orbits: Unremarkable orbits. Paranasal sinuses and mastoid air cells are clear. Other: None. IMPRESSION: 1. No acute intracranial abnormality. 2. Mild-to-moderate chronic small vessel ischemic disease. 3.  Numerous chronic cerebral and cerebellar microhemorrhages which could be related to chronic hypertension or cerebral amyloid angiopathy. Electronically Signed   By: Logan Bores M.D.   On: 11/23/2018 11:07     CBC Recent Labs  Lab 11/22/18 1910 11/23/18 0401  WBC 5.9 6.4  HGB 14.7 14.3  HCT 43.8 42.5  PLT 218 235  MCV 95.2 96.6  MCH 32.0 32.5  MCHC 33.6 33.6  RDW 12.5 12.3  LYMPHSABS 1.2  --   MONOABS 0.7  --   EOSABS 0.8*  --   BASOSABS 0.0  --     Chemistries  Recent Labs  Lab 11/22/18 1910 11/23/18 0401  NA 137 140  K 3.9 3.5  CL 102 105  CO2 27 28  GLUCOSE 104* 89  BUN 12 12  CREATININE 0.98 0.94  CALCIUM 9.3 9.0  AST 29  --   ALT 23  --   ALKPHOS 67  --   BILITOT 0.7  --    ------------------------------------------------------------------------------------------------------------------ estimated creatinine clearance is 37.3 mL/min (by C-G formula based on SCr of 0.94 mg/dL). ------------------------------------------------------------------------------------------------------------------ No results for input(s): HGBA1C in the last 72 hours. ------------------------------------------------------------------------------------------------------------------ No results for  input(s): CHOL, HDL, LDLCALC, TRIG, CHOLHDL, LDLDIRECT in the last 72 hours. ------------------------------------------------------------------------------------------------------------------ Recent Labs    11/22/18 1910  TSH 2.971   ------------------------------------------------------------------------------------------------------------------ No results for input(s): VITAMINB12, FOLATE, FERRITIN, TIBC, IRON, RETICCTPCT in the last 72 hours.  Coagulation profile No results for input(s): INR, PROTIME in the last 168 hours.  No results for input(s): DDIMER in the last 72 hours.  Cardiac Enzymes Recent Labs  Lab 11/22/18 1910  TROPONINI <0.03   ------------------------------------------------------------------------------------------------------------------ Invalid input(s): POCBNP    Assessment & Plan   Patient is 78 year old with history of seizure disorder  1.  Acute encephalopathy suspect due to seizures Will obtain MRI of the brain  2.  Seizures no Dilantin level was checked I will check a Dilantin level Neurology has been consulted  3.  GERD continue PPI  4  History of breast cancer unclear status of this currently  5.  Weakness obtain PT evaluation      Code Status Orders  (From admission, onward)         Start     Ordered   11/23/18 0045  Limited resuscitation (code)  Continuous    Question Answer Comment  In the event of cardiac or respiratory ARREST: Initiate Code Blue, Call Rapid Response Yes   In the event of cardiac or respiratory ARREST: Perform CPR Yes   In the event of cardiac or respiratory ARREST: Perform Intubation/Mechanical Ventilation No   In the event of cardiac or respiratory ARREST: Use NIPPV/BiPAp only if indicated Yes   In the event of cardiac or respiratory ARREST: Administer ACLS medications if indicated Yes   In the event of cardiac or respiratory ARREST: Perform Defibrillation or Cardioversion if indicated Yes      11/23/18  0044        Code Status History    Date Active Date Inactive Code Status Order ID Comments User Context   02/21/2015 2345 02/22/2015 1906 Full Code [id number]  Dia Sitter, RN Inpatient           Consults  none   DVT Prophylaxis  Lovenox   Lab Results  Component Value Date   PLT 235 11/23/2018     Time Spent in minutes  68min Greater than 50% of time spent in care coordination and counseling patient regarding the condition  and plan of care.   Dustin Flock M.D on 11/23/2018 at 11:21 AM  Between 7am to 6pm - Pager - [phone]  After 6pm go to [email or web address] - Proofreader  [employer]   Office  [phone]

## 2018-11-23 NOTE — Consult Note (Addendum)
Reason for Consult: Altered mental status Referring Physician: Dustin Flock, MD  CC: Altered mental status  HPI: [name] is an 78 y.o. female with past medical history of brain tumor status post resection (1999-2006), breast cancer, and seizure disorder on Dilantin 100 mg 3 times daily presenting to the ED on 11/22/2018 with altered mental status.  Patient reports that she lives alone and manages her own medication and finances without any difficulty.  She does not recall the events prior to admission however she does recall calling her daughter to bring her something to eat and that she was not feeling well.  Per ED report, the previous night daughter spoke to patient and noted that she was having some difficulty clearing her thought and she sounded congested.  The next day in the afternoon patient called her daughter to bring her something to eat and when she came to the house found her in bed unresponsive.  Patient's daughter therefore called EMS and on arrival patient was given Narcan with some improvement in her level of awareness.  There was no report of witnessed syncope, seizure like activity loss of bladder control or tongue biting, or other obvious focal neurologic deficit.  Patient denies recent fall, head trauma or starting new medications which could have caused alteration of awareness.  Initial work-up with CT head did not show evidence of acute intracranial abnormality.  Chest x-ray was also negative for active cardiopulmonary disease.  Initial labs revealed CK 55, TSH 2.971, ammonia< 9, differential troponins, slightly decreased kidney function otherwise normal CMP, normal CBC, UDS negative, UA negative for UTI.  Patient was therefore admitted for further work-up and management.  She is currently resting in bed alert and oriented x4 denies any concerns.  Past Medical History:  Diagnosis Date  . Basal cell carcinoma of skin of face 02/2011  . Brain tumor ([place]) 2006  . Breast cancer  ([place]) 2010   right mastectomy  . 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 H/O mammogram 02/02/2015   within normal limits.  . History of chemotherapy    Received 5-6 cycles adjuvant chemotherapy with Taxotere, Carboplatin, Herceptin ( last chemotherapy was on 07/21/09, patient discontinued Herceptin in December 2010).  . Osteoporosis 09/2012   seen on DEXA scan 2013  . Seizure disorder [facility])     Past Surgical History:  Procedure Laterality Date  . left craniotomy  2006  . MASTECTOMY Right   . MASTECTOMY WITH AXILLARY LYMPH NODE DISSECTION  January 30, 2009  . PARTIAL HYSTERECTOMY      Family History  Problem Relation Age of Onset  . Cancer Maternal Aunt        unknown primary  . Breast cancer Neg Hx   . Bladder Cancer Neg Hx   . Kidney cancer Neg Hx     Social History:  reports that she has never smoked. She has never used smokeless tobacco. She reports that she does not drink alcohol or use drugs.  Allergies  Allergen Reactions  . Other Diarrhea    Food Allergy to Bananas- cause diarrhea Band Aids cause Rash   . Vicodin [Hydrocodone-Acetaminophen] Other (See Comments)    GI Distress    Medications:  I have reviewed the patient's current medications. Prior to Admission:  Medications Prior to Admission  Medication Sig Dispense Refill Last Dose  . alendronate (FOSAMAX) 70 MG tablet Take 1 tablet by mouth once a week.  Past Week at Unknown time  . naproxen (NAPROSYN) 500 MG tablet Take 1 tablet by mouth 3 (three) times daily with meals.    Unknown at Unknown  . phenytoin (DILANTIN) 100 MG ER capsule Take 100 mg by mouth 3 (three) times daily.   11/22/2018 at Unknown time  . Calcium Carbonate-Vitamin D 600-400 MG-UNIT tablet Take 1 tablet by mouth daily. 1   Taking  . cephALEXin (KEFLEX) 500 MG capsule Take 1 capsule (500 mg total) by mouth 3 (three) times daily. (Patient not taking: Reported on 11/23/2018)  30 capsule 0 Completed Course at Unknown time  . Cyanocobalamin (VITAMIN B-12 PO) Take by mouth daily.   Taking  . Multiple Vitamins-Calcium (ONE-A-DAY WOMENS FORMULA PO) Take 1 tablet by mouth daily.   Taking  . omeprazole (PRILOSEC) 20 MG capsule Take 20 mg by mouth daily.    Taking   Scheduled: . calcium-vitamin D  1 tablet Oral Daily  . heparin  5,000 Units Subcutaneous Q8H  . naproxen  500 mg Oral TID WC  . pantoprazole  40 mg Oral Daily  . phenytoin  100 mg Oral TID  . vitamin B-12  500 mcg Oral Daily    ROS: History obtained from the patient   General ROS: negative for - chills, fatigue, fever, night sweats, weight gain or weight loss Psychological ROS: negative for - behavioral disorder, hallucinations, memory difficulties, mood swings or suicidal ideation Ophthalmic ROS: negative for - blurry vision, double vision, eye pain or loss of vision ENT ROS: negative for - epistaxis, nasal discharge, oral lesions, sore throat, tinnitus or vertigo Allergy and Immunology ROS: negative for - hives or itchy/watery eyes Hematological and Lymphatic ROS: negative for - bleeding problems, bruising or swollen lymph nodes Endocrine ROS: negative for - galactorrhea, hair pattern changes, polydipsia/polyuria or temperature intolerance Respiratory ROS: negative for - cough, hemoptysis, shortness of breath or wheezing Cardiovascular ROS: negative for - chest pain, dyspnea on exertion, edema or irregular heartbeat Gastrointestinal ROS: negative for - abdominal pain, diarrhea, hematemesis, nausea/vomiting or stool incontinence Genito-Urinary ROS: negative for - dysuria, hematuria, incontinence or urinary frequency/urgency Musculoskeletal ROS: negative for - joint swelling or muscular weakness Neurological ROS: as noted in HPI Dermatological ROS: negative for rash and skin lesion changes  Physical Examination: Blood pressure 132/74, pulse 71, temperature (!) 97.5 F (36.4 C), temperature source  Oral, resp. rate 17, height '5\' 3"'  (1.6 m), weight 48.7 kg, SpO2 96 %.  HEENT-  Normocephalic, no lesions, without obvious abnormality.  Normal external eye and conjunctiva.  Normal TM's bilaterally.  Normal auditory canals and external ears. Normal external nose, mucus membranes and septum.  Normal pharynx. Cardiovascular- S1, S2 normal, pulses palpable throughout   Lungs- chest clear, no wheezing, rales, normal symmetric air entry Abdomen- soft, non-tender; bowel sounds normal; no masses,  no organomegaly Extremities- no edema Lymph-no adenopathy palpable Musculoskeletal-no joint tenderness, deformity or swelling Skin-warm and dry, no hyperpigmentation, vitiligo, or suspicious lesions  Neurological Exam   Mental Status: Alert, oriented, thought content appropriate.  Speech fluent without evidence of aphasia.  Able to follow 3 step commands without difficulty. Attention span and concentration seemed appropriate  Cranial Nerves: II: Discs flat bilaterally; Visual fields grossly normal, pupils equal, round, reactive to light and accommodation III,IV, VI: ptosis not present, extra-ocular motions intact bilaterally V,VII: smile symmetric, facial light touch sensation intact VIII: hearing normal bilaterally IX,X: gag reflex present XI: bilateral shoulder shrug XII: midline tongue extension Motor: Right :  Upper extremity  5/5 Without pronator drift      Left: Upper extremity   5/5 without pronator drift Right:   Lower extremity   5/5                                          Left: Lower extremity   5/5 Tone and bulk:normal tone throughout; no atrophy noted Sensory: Pinprick and light touch intact bilaterally Deep Tendon Reflexes: 2+ and symmetric throughout Plantars: Right: mute                              Left: mute Cerebellar: Finger-to-nose testing intact bilaterally. Heel to shin testing normal bilaterally Gait: not tested due to safety concerns  Data Reviewed  Laboratory  Studies:   Basic Metabolic Panel: Recent Labs  Lab 11/22/18 1910 11/23/18 0401  NA 137 140  K 3.9 3.5  CL 102 105  CO2 27 28  GLUCOSE 104* 89  BUN 12 12  CREATININE 0.98 0.94  CALCIUM 9.3 9.0    Liver Function Tests: Recent Labs  Lab 11/22/18 1910  AST 29  ALT 23  ALKPHOS 67  BILITOT 0.7  PROT 6.6  ALBUMIN 4.1   Recent Labs  Lab 11/22/18 1910  LIPASE 37   Recent Labs  Lab 11/22/18 1910  AMMONIA <9*    CBC: Recent Labs  Lab 11/22/18 1910 11/23/18 0401  WBC 5.9 6.4  NEUTROABS 3.2  --   HGB 14.7 14.3  HCT 43.8 42.5  MCV 95.2 96.6  PLT 218 235    Cardiac Enzymes: Recent Labs  Lab 11/22/18 1910  CKTOTAL 78  TROPONINI <0.03    BNP: Invalid input(s): POCBNP  CBG: No results for input(s): GLUCAP in the last 168 hours.  Microbiology: Results for orders placed or performed during the hospital encounter of 06/10/18  Urine Culture     Status: Abnormal   Collection Time: 06/10/18  8:53 PM  Result Value Ref Range Status   Specimen Description   Final    URINE, CATHETERIZED Performed at [facility], [address] [place], [place] [address]    Special Requests   Final    NONE Performed at [facility], [address] [place], [place] [address]    Culture (A)  Final    <10,000 COLONIES/mL INSIGNIFICANT GROWTH Performed at [facility], [place] [address], [place], [place] [address]    Report Status 06/12/2018 FINAL  Final    Coagulation Studies: No results for input(s): LABPROT, INR in the last 72 hours.  Urinalysis:  Recent Labs  Lab 11/22/18 1910  COLORURINE YELLOW*  LABSPEC 1.004*  PHURINE 7.0  GLUCOSEU NEGATIVE  HGBUR NEGATIVE  BILIRUBINUR NEGATIVE  KETONESUR NEGATIVE  PROTEINUR NEGATIVE  NITRITE NEGATIVE  LEUKOCYTESUR NEGATIVE    Lipid Panel:  No results found for: CHOL, TRIG, HDL, CHOLHDL, VLDL, LDLCALC  HgbA1C: No results found for: HGBA1C  Urine Drug Screen:      Component Value Date/Time    LABOPIA NONE DETECTED 11/22/2018 1910   COCAINSCRNUR NONE DETECTED 11/22/2018 1910   LABBENZ NONE DETECTED 11/22/2018 1910   AMPHETMU NONE DETECTED 11/22/2018 1910   THCU NONE DETECTED 11/22/2018 1910   LABBARB NONE DETECTED 11/22/2018 1910    Alcohol Level:  Recent Labs  Lab 11/22/18 1910  ETH <10    Other results: EKG:  normal EKG, normal sinus rhythm, unchanged from previous tracings. Vent. rate 80 BPM PR interval * ms QRS duration 151 ms QT/QTc 413/477 ms P-R-T axes 60 -38 111  Imaging: Dg Chest 1 View  Result Date: 11/22/2018 CLINICAL DATA:  Patient is lethargic. EXAM: CHEST  1 VIEW COMPARISON:  April 02, 2009 FINDINGS: The heart size and mediastinal contours are within normal limits. Both lungs are clear. The visualized skeletal structures are unremarkable. IMPRESSION: No active disease. Electronically Signed   By: Dorise Bullion III M.D   On: 11/22/2018 20:25   Ct Head Wo Contrast  Result Date: 11/22/2018 CLINICAL DATA:  78 year old female with altered mental status. EXAM: CT HEAD WITHOUT CONTRAST TECHNIQUE: Contiguous axial images were obtained from the base of the skull through the vertex without intravenous contrast. COMPARISON:  08/17/2006 CT FINDINGS: Brain: No evidence of acute infarction, hemorrhage, hydrocephalus, extra-axial collection or mass lesion/mass effect. Mild chronic small-vessel white matter ischemic changes, small area of LEFT frontoparietal encephalomalacia and heavily calcified meningiomas along the LATERAL LEFT frontotemporal region again noted. Vascular: Mild carotid atherosclerotic calcifications noted. Skull: No acute abnormality. LEFT parietal craniotomy changes again noted. Sinuses/Orbits: No acute abnormality Other: None IMPRESSION: 1. No evidence of acute intracranial abnormality 2. Mild chronic small-vessel white matter ischemic changes. Electronically Signed   By: Margarette Canada M.D.   On: 11/22/2018 20:56    Patient seen and examined.  Clinical  course and management discussed.  Necessary edits performed.  I agree with the above.  Assessment and plan of care developed and discussed below.     Assessment: 78 y.o female with past medical history of brain tumor status post resection (1999-2006), breast cancer, and seizure disorder on Dilantin 100 mg 3 times daily presenting to the ED on 11/22/2018 with altered mental status. No witnessed seizures however daughter found her unresponsive and confused concerning for possible seizure episode. Patient has quickly returned to baseline.  Breakthrough seizure suspected.  Patient states she does not recall the events clearly but has on some occassions missed or delayed taking her Dilantin. MRI of the brain reviewed and shows no acute changes.  Evidence of chronic microhemorrhages noted.  Dilantin level 13.0 (trough).   Plan: 1. Continue Dilantin at 198m TID.  Level therapeutic.   2. Would start Lamictal 259mBID   3. Seizure precautions 4. Patient unable to drive, operate heavy machinery, perform activities at heights and participate in water activities until release by outpatient physician.  This patient was staffed with Dr. LeMagda PaganiniReDoy Minceho personally evaluated patient, reviewed documentation and agreed with assessment and plan of care as above.  ElRufina FalcoDNP, FNP-BC Board certified Nurse Practitioner Neurology Department  11/23/2018, 10:33 AM    LeAlexis GoodellMD Neurology [phone]/31/2020  2:10 PM

## 2018-11-23 NOTE — Progress Notes (Signed)
Family Meeting Note  Advance Directive:yes  Today a meeting took place with the daughter.  Patient is unable to participate due [id number] capacity confusion   The following clinical team members were present during this meeting:MD  The following were discussed:Patient's diagnosis: Seizure disorder, altered mental status, history of cancer and brain mass., Patient's progosis: Unable to determine and Goals for treatment: Continue present management  Additional follow-up to be provided: neurology  Time spent during discussion:20 minutes  Vaughan Basta, MD

## 2018-11-23 NOTE — Progress Notes (Signed)
Concerned about pt's cognition. Pt Alert and Oriented x 4 per assessment questions, however in conversation pt needs reoriented and makes statements such as "oh well I never can remember what day it is" for example.

## 2018-11-23 NOTE — ED Notes (Signed)
ED TO INPATIENT HANDOFF REPORT  ED Nurse Name and Phone #:  Shirlean Mylar RN  Name/Age/Gender [name] 78 y.o. female Room/Bed: ED06A/ED06A  Code Status   Code Status: Prior  Home/SNF/Other Hospital Patient oriented to: self Is this baseline? No   Triage Complete: Triage complete   Chief Complaint ams  Triage Note Pt arrives from home via [facility]. Pt lives alone. EMS reports that family found pt lethargic and altered at home. EMS reports unsure last known well. Doesn't think pt fell. Thinks pt took too much of her medication but pt had removed labels from bottles so unsure what pt takes. EMS gave 1.5 intranasal narcan and pt became agitated. Pt is not talking upon arrival but moving all extremities. Prior to narcan pt oxygen was 80% on RA. After narcan pt oxygen came up to 94%. CBG 119. HR in 80's, BP 130/74.    Allergies Allergies  Allergen Reactions  . Other Diarrhea    Food Allergy to Bananas- cause diarrhea Band Aids cause Rash   . Vicodin [Hydrocodone-Acetaminophen] Other (See Comments)    GI Distress    Level of Care/Admitting Diagnosis ED Disposition    ED Disposition Condition [facility] Area: [place] [[id number]]  Level of Care: Med-Surg [16]  Diagnosis: Altered mental status [780.97.ICD-9-CM]  Admitting Physician: Vaughan Basta [[id number]]  Attending Physician: Vaughan Basta [phone]  PT Class (Do Not Modify): Observation [104]  PT Acc Code (Do Not Modify): Observation [10022]       Medical/Surgery History Past Medical History:  Diagnosis Date  . Basal cell carcinoma of skin of face 02/2011  . Brain tumor ([place]) 2006  . Breast cancer ([place]) 2010   right mastectomy  . 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 H/O mammogram 02/02/2015   within normal limits.  . History of chemotherapy    Received 5-6 cycles adjuvant chemotherapy with  Taxotere, Carboplatin, Herceptin ( last chemotherapy was on 07/21/09, patient discontinued Herceptin in December 2010).  . Osteoporosis 09/2012   seen on DEXA scan 2013  . Seizure disorder [facility])    Past Surgical History:  Procedure Laterality Date  . left craniotomy  2006  . MASTECTOMY Right   . MASTECTOMY WITH AXILLARY LYMPH NODE DISSECTION  January 30, 2009  . PARTIAL HYSTERECTOMY       IV Location/Drains/Wounds Patient Lines/Drains/Airways Status   Active Line/Drains/Airways    Name:   Placement date:   Placement time:   Site:   Days:   Peripheral IV 11/22/18 Right Forearm   11/22/18    1916    Forearm   1          Intake/Output Last 24 hours No intake or output data in the 24 hours ending 11/23/18 0033  Labs/Imaging Results for orders placed or performed during the hospital encounter of 11/22/18 (from the past 48 hour(s))  CBC with Differential     Status: Abnormal   Collection Time: 11/22/18  7:10 PM  Result Value Ref Range   WBC 5.9 4.0 - 10.5 K/uL   RBC 4.60 3.87 - 5.11 MIL/uL   Hemoglobin 14.7 12.0 - 15.0 g/dL   HCT 43.8 36.0 - 46.0 %   MCV 95.2 80.0 - 100.0 fL   MCH 32.0 26.0 - 34.0 pg   MCHC 33.6 30.0 - 36.0 g/dL   RDW 12.5 11.5 - 15.5 %   Platelets 218 150 -  400 K/uL   nRBC 0.0 0.0 - 0.2 %   Neutrophils Relative % 54 %   Neutro Abs 3.2 1.7 - 7.7 K/uL   Lymphocytes Relative 20 %   Lymphs Abs 1.2 0.7 - 4.0 K/uL   Monocytes Relative 11 %   Monocytes Absolute 0.7 0.1 - 1.0 K/uL   Eosinophils Relative 14 %   Eosinophils Absolute 0.8 (H) 0.0 - 0.5 K/uL   Basophils Relative 1 %   Basophils Absolute 0.0 0.0 - 0.1 K/uL   Immature Granulocytes 0 %   Abs Immature Granulocytes 0.01 0.00 - 0.07 K/uL    Comment: Performed at [facility], [address] [place], [place] [address]  Comprehensive metabolic panel     Status: Abnormal   Collection Time: 11/22/18  7:10 PM  Result Value Ref Range   Sodium 137 135 - 145 mmol/L   Potassium 3.9 3.5 - 5.1 mmol/L    Chloride 102 98 - 111 mmol/L   CO2 27 22 - 32 mmol/L   Glucose, Bld 104 (H) 70 - 99 mg/dL   BUN 12 8 - 23 mg/dL   Creatinine, Ser 0.98 0.44 - 1.00 mg/dL   Calcium 9.3 8.9 - 10.3 mg/dL   Total Protein 6.6 6.5 - 8.1 g/dL   Albumin 4.1 3.5 - 5.0 g/dL   AST 29 15 - 41 U/L   ALT 23 0 - 44 U/L   Alkaline Phosphatase 67 38 - 126 U/L   Total Bilirubin 0.7 0.3 - 1.2 mg/dL   GFR calc non Af Amer 55 (L) >60 mL/min   GFR calc Af Amer >60 >60 mL/min   Anion gap 8 5 - 15    Comment: Performed at [facility], [address] [place], [place] [address]  Troponin I - ONCE - STAT     Status: None   Collection Time: 11/22/18  7:10 PM  Result Value Ref Range   Troponin I <0.03 <0.03 ng/mL    Comment: Performed at [facility], [address] [place], [place] [address]  Lipase, blood     Status: None   Collection Time: 11/22/18  7:10 PM  Result Value Ref Range   Lipase 37 11 - 51 U/L    Comment: Performed at [facility], [address] [place], [place] [address]  Ammonia     Status: Abnormal   Collection Time: 11/22/18  7:10 PM  Result Value Ref Range   Ammonia <9 (L) 9 - 35 umol/L    Comment: Performed at [facility], [address] [place], [place] [address]  TSH     Status: None   Collection Time: 11/22/18  7:10 PM  Result Value Ref Range   TSH 2.971 0.350 - 4.500 uIU/mL    Comment: Performed by a 3rd Generation assay with a functional sensitivity of <=0.01 uIU/mL. Performed at [facility], [address], [place], [place] [address]   Urine Drug Screen, Qualitative [facility] only)     Status: None   Collection Time: 11/22/18  7:10 PM  Result Value Ref Range   Tricyclic, Ur Screen NONE DETECTED NONE DETECTED   Amphetamines, Ur Screen NONE DETECTED NONE DETECTED   MDMA (Ecstasy)Ur Screen NONE DETECTED NONE DETECTED   Cocaine Metabolite,Ur [place] NONE DETECTED NONE DETECTED   Opiate, Ur Screen NONE DETECTED NONE DETECTED   Phencyclidine (PCP)  Ur S NONE DETECTED NONE DETECTED   Cannabinoid 50 Ng, Ur [place] NONE DETECTED NONE DETECTED  Barbiturates, Ur Screen NONE DETECTED NONE DETECTED   Benzodiazepine, Ur Scrn NONE DETECTED NONE DETECTED   Methadone Scn, Ur NONE DETECTED NONE DETECTED    Comment: (NOTE) Tricyclics + metabolites, urine    Cutoff 1000 ng/mL Amphetamines + metabolites, urine  Cutoff 1000 ng/mL MDMA (Ecstasy), urine              Cutoff 500 ng/mL Cocaine Metabolite, urine          Cutoff 300 ng/mL Opiate + metabolites, urine        Cutoff 300 ng/mL Phencyclidine (PCP), urine         Cutoff 25 ng/mL Cannabinoid, urine                 Cutoff 50 ng/mL Barbiturates + metabolites, urine  Cutoff 200 ng/mL Benzodiazepine, urine              Cutoff 200 ng/mL Methadone, urine                   Cutoff 300 ng/mL The urine drug screen provides only a preliminary, unconfirmed analytical test result and should not be used for non-medical purposes. Clinical consideration and professional judgment should be applied to any positive drug screen result due to possible interfering substances. A more specific alternate chemical method must be used in order to obtain a confirmed analytical result. Gas chromatography / mass spectrometry (GC/MS) is the preferred confirmat ory method. Performed at [facility], [address] [place], [place] [address]   Acetaminophen level     Status: Abnormal   Collection Time: 11/22/18  7:10 PM  Result Value Ref Range   Acetaminophen (Tylenol), Serum <10 (L) 10 - 30 ug/mL    Comment: (NOTE) Therapeutic concentrations vary significantly. A range of 10-30 ug/mL  may be an effective concentration for many patients. However, some  are best treated at concentrations outside of this range. Acetaminophen concentrations >150 ug/mL at 4 hours after ingestion  and >50 ug/mL at 12 hours after ingestion are often associated with  toxic reactions. Performed at [facility], [address] [place], [place] [address]   Salicylate level     Status: None   Collection Time: 11/22/18  7:10 PM  Result Value Ref Range   Salicylate Lvl <5.3 2.8 - 30.0 mg/dL    Comment: Performed at [facility], [address] [place], [place] [address]  Ethanol     Status: None   Collection Time: 11/22/18  7:10 PM  Result Value Ref Range   Alcohol, Ethyl (B) <10 <10 mg/dL    Comment: (NOTE) Lowest detectable limit for serum alcohol is 10 mg/dL. For medical purposes only. Performed at [facility], [address] [place], [place] [address]   Urinalysis, Complete w Microscopic     Status: Abnormal   Collection Time: 11/22/18  7:10 PM  Result Value Ref Range   Color, Urine YELLOW (A) YELLOW   APPearance CLEAR (A) CLEAR   Specific Gravity, Urine 1.004 (L) 1.005 - 1.030   pH 7.0 5.0 - 8.0   Glucose, UA NEGATIVE NEGATIVE mg/dL   Hgb urine dipstick NEGATIVE NEGATIVE   Bilirubin Urine NEGATIVE NEGATIVE   Ketones, ur NEGATIVE NEGATIVE mg/dL   Protein, ur NEGATIVE NEGATIVE mg/dL   Nitrite NEGATIVE NEGATIVE   Leukocytes, UA NEGATIVE NEGATIVE   RBC / HPF 0-5 0 - 5 RBC/hpf   WBC, UA 0-5 0 - 5 WBC/hpf   Bacteria, UA NONE SEEN NONE  SEEN   Squamous Epithelial / LPF 0-5 0 - 5    Comment: Performed at [facility], [address] [place], [place] [address]  CK     Status: None   Collection Time: 11/22/18  7:10 PM  Result Value Ref Range   Total CK 55 38 - 234 U/L    Comment: Performed at [facility], [address] [place], [place] [address]  Influenza panel by PCR (type A & B)     Status: None   Collection Time: 11/22/18  8:47 PM  Result Value Ref Range   Influenza A By PCR NEGATIVE NEGATIVE   Influenza B By PCR NEGATIVE NEGATIVE    Comment: (NOTE) The Xpert Xpress Flu assay is intended as an aid in the diagnosis of  influenza and should not be used as a sole basis for treatment.  This  assay is FDA approved for nasopharyngeal swab specimens  only. Nasal  washings and aspirates are unacceptable for Xpert Xpress Flu testing. Performed at [facility], [address] [place], [place] [address]    Dg Chest 1 View  Result Date: 11/22/2018 CLINICAL DATA:  Patient is lethargic. EXAM: CHEST  1 VIEW COMPARISON:  April 02, 2009 FINDINGS: The heart size and mediastinal contours are within normal limits. Both lungs are clear. The visualized skeletal structures are unremarkable. IMPRESSION: No active disease. Electronically Signed   By: Dorise Bullion III M.D   On: 11/22/2018 20:25   Ct Head Wo Contrast  Result Date: 11/22/2018 CLINICAL DATA:  78 year old female with altered mental status. EXAM: CT HEAD WITHOUT CONTRAST TECHNIQUE: Contiguous axial images were obtained from the base of the skull through the vertex without intravenous contrast. COMPARISON:  08/17/2006 CT FINDINGS: Brain: No evidence of acute infarction, hemorrhage, hydrocephalus, extra-axial collection or mass lesion/mass effect. Mild chronic small-vessel white matter ischemic changes, small area of LEFT frontoparietal encephalomalacia and heavily calcified meningiomas along the LATERAL LEFT frontotemporal region again noted. Vascular: Mild carotid atherosclerotic calcifications noted. Skull: No acute abnormality. LEFT parietal craniotomy changes again noted. Sinuses/Orbits: No acute abnormality Other: None IMPRESSION: 1. No evidence of acute intracranial abnormality 2. Mild chronic small-vessel white matter ischemic changes. Electronically Signed   By: Margarette Canada M.D.   On: 11/22/2018 20:56    Pending Labs Unresulted Labs (From admission, onward)    Start     Ordered   11/22/18 2258  Phenytoin level, free and total  Once,   STAT     11/22/18 2257   Signed and Held  Basic metabolic panel  Tomorrow morning,   R     Signed and Held   Signed and Held  CBC  Tomorrow morning,   R     Signed and Held   Signed and Held  CBC  (heparin)  Once,   R    Comments:  Baseline for  heparin therapy IF NOT ALREADY DRAWN.  Notify MD if PLT < 100 K.    Signed and Held   Signed and Held  Creatinine, serum  (heparin)  Once,   R    Comments:  Baseline for heparin therapy IF NOT ALREADY DRAWN.    Signed and Held          Vitals/Pain Today's Vitals   11/22/18 2230 11/22/18 2330 11/22/18 2345 11/23/18 0000  BP: 110/67 125/78  105/64  Pulse: 67 69 65 64  Resp: '14 18 18 ' (!) 21  Temp:      TempSrc:  SpO2: 99% 95% 98% 94%  Weight:      Height:        Isolation Precautions Droplet precaution  Medications Medications  LORazepam (ATIVAN) injection 1 mg (1 mg Intravenous Given 11/22/18 1912)    Mobility Strecher High fall risk   Focused Assessments Altered mental status   Recommendations: See Admitting Provider Note  Report given to:   Additional Notes:  Report called

## 2018-11-23 NOTE — H&P (Signed)
[facility] at [place] NAME: [name]    MR#:  [id number]  DATE OF BIRTH:  1941/02/22  DATE OF ADMISSION:  11/22/2018  PRIMARY CARE PHYSICIAN: Baxter Hire, MD   REQUESTING/REFERRING PHYSICIAN: Clearnce Hasten  CHIEF COMPLAINT:   Chief Complaint  Patient presents with  . Altered Mental Status    HISTORY OF PRESENT ILLNESS: [name]  is a 78 y.o. female with a known history of brain tumor, breast cancer, seizure disorder, osteoporosis-lives alone in her apartment and walks with a walker but independent in day-to-day activities.  Her daughter lives nearby and she goes and helps with groceries and check on her. Last night daughter spoke to her and she was having some cough and congestion symptoms at that time.  After her work in the afternoon today daughter went to give her some food or groceries and she found her in her bed.  She tried to wake her up but she was not responding much.  She called ambulance and EMT gave her Narcan injection which helped her to wake up partially but still remained somewhat drowsy so they brought her to the emergency room. Daughter denies patient taking any opioids or benzodiazepines.  She had not seen any seizure-like activities or found patient incontinent. In ER initial infection work-ups were negative.  Patient remained partially aroused and somewhat confused so ER physician suggested to keep on medical services for further management.  CT scan head was negative. Later around 11:00 pm, when I saw, patient was asleep but easily arousable and she knew where she is, she could identify her daughter, but she did not know the events what happened at her home.  PAST MEDICAL HISTORY:   Past Medical History:  Diagnosis Date  . Basal cell carcinoma of skin of face 02/2011  . Brain tumor ([place]) 2006  . Breast cancer ([place]) 2010   right mastectomy  . 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 H/O mammogram 02/02/2015   within normal limits.  . History of chemotherapy    Received 5-6 cycles adjuvant chemotherapy with Taxotere, Carboplatin, Herceptin ( last chemotherapy was on 07/21/09, patient discontinued Herceptin in December 2010).  . Osteoporosis 09/2012   seen on DEXA scan 2013  . Seizure disorder ([place])     PAST SURGICAL HISTORY:  Past Surgical History:  Procedure Laterality Date  . left craniotomy  2006  . MASTECTOMY Right   . MASTECTOMY WITH AXILLARY LYMPH NODE DISSECTION  January 30, 2009  . PARTIAL HYSTERECTOMY      SOCIAL HISTORY:  Social History   Tobacco Use  . Smoking status: Never Smoker  . Smokeless tobacco: Never Used  Substance Use Topics  . Alcohol use: No    Alcohol/week: 0.0 standard drinks    FAMILY HISTORY:  Family History  Problem Relation Age of Onset  . Cancer Maternal Aunt        unknown primary  . Breast cancer Neg Hx   . Bladder Cancer Neg Hx   . Kidney cancer Neg Hx     DRUG ALLERGIES:  Allergies  Allergen Reactions  . Other Diarrhea    Food Allergy to Bananas- cause diarrhea Band Aids cause Rash   . Vicodin [Hydrocodone-Acetaminophen] Other (See Comments)    GI Distress    REVIEW OF SYSTEMS:   CONSTITUTIONAL: No fever, fatigue or weakness.  EYES: No blurred or double vision.  EARS, NOSE, AND THROAT: No tinnitus or ear pain.  RESPIRATORY: No cough, shortness of breath, wheezing or hemoptysis.  CARDIOVASCULAR: No chest pain, orthopnea, edema.  GASTROINTESTINAL: No nausea, vomiting, diarrhea or abdominal pain.  GENITOURINARY: No dysuria, hematuria.  ENDOCRINE: No polyuria, nocturia,  HEMATOLOGY: No anemia, easy bruising or bleeding SKIN: No rash or lesion. MUSCULOSKELETAL: No joint pain or arthritis.   NEUROLOGIC: No tingling, numbness, weakness.  PSYCHIATRY: No anxiety or depression.   MEDICATIONS AT HOME:  Prior to Admission medications   Medication Sig Start Date End Date  Taking? Authorizing Provider  naproxen (NAPROSYN) 500 MG tablet Take 1 tablet by mouth 3 (three) times daily with meals.  07/27/15  Yes [provider]  phenytoin (DILANTIN) 100 MG ER capsule Take 100 mg by mouth 3 (three) times daily.   Yes [provider]  alendronate (FOSAMAX) 70 MG tablet Take 1 tablet by mouth once a week.  07/08/15   [provider]  Calcium Carbonate-Vitamin D 600-400 MG-UNIT tablet Take 1 tablet by mouth daily. 1    [provider]  cephALEXin (KEFLEX) 500 MG capsule Take 1 capsule (500 mg total) by mouth 3 (three) times daily. 06/10/18   Nance Pear, MD  Cyanocobalamin (VITAMIN B-12 PO) Take by mouth daily.    [provider]  Multiple Vitamins-Calcium (ONE-A-DAY WOMENS FORMULA PO) Take 1 tablet by mouth daily.    [provider]  omeprazole (PRILOSEC) 20 MG capsule Take 20 mg by mouth daily.  08/10/15   [provider]      PHYSICAL EXAMINATION:   VITAL SIGNS: Blood pressure 118/62, pulse 72, temperature (!) 97.4 F (36.3 C), temperature source Oral, resp. rate 16, height '5\' 3"'  (1.6 m), weight 52.2 kg, SpO2 99 %.  GENERAL:  78 y.o.-year-old patient lying in the bed with no acute distress.  EYES: Pupils equal, round, reactive to light and accommodation. No scleral icterus. Extraocular muscles intact.  HEENT: Head atraumatic, normocephalic. Oropharynx and nasopharynx clear.  NECK:  Supple, no jugular venous distention. No thyroid enlargement, no tenderness.  LUNGS: Normal breath sounds bilaterally, no wheezing, rales,rhonchi or crepitation. No use of accessory muscles of respiration.  CARDIOVASCULAR: S1, S2 normal. No murmurs, rubs, or gallops.  ABDOMEN: Soft, nontender, nondistended. Bowel sounds present. No organomegaly or mass.  EXTREMITIES: No pedal edema, cyanosis, or clubbing.  NEUROLOGIC: Cranial nerves II through XII are intact. Muscle strength 4/5 in all extremities. Sensation intact. Gait not  checked.  PSYCHIATRIC: The patient is alert and oriented x 3.  SKIN: No obvious rash, lesion, or ulcer.   LABORATORY PANEL:   CBC Recent Labs  Lab 11/22/18 1910  WBC 5.9  HGB 14.7  HCT 43.8  PLT 218  MCV 95.2  MCH 32.0  MCHC 33.6  RDW 12.5  LYMPHSABS 1.2  MONOABS 0.7  EOSABS 0.8*  BASOSABS 0.0   ------------------------------------------------------------------------------------------------------------------  Chemistries  Recent Labs  Lab 11/22/18 1910  NA 137  K 3.9  CL 102  CO2 27  GLUCOSE 104*  BUN 12  CREATININE 0.98  CALCIUM 9.3  AST 29  ALT 23  ALKPHOS 67  BILITOT 0.7   ------------------------------------------------------------------------------------------------------------------ estimated creatinine clearance is 38.4 mL/min (by C-G formula based on SCr of 0.98 mg/dL). ------------------------------------------------------------------------------------------------------------------ Recent Labs    11/22/18 1910  TSH 2.971     Coagulation profile No results for input(s): INR, PROTIME in the last 168 hours. ------------------------------------------------------------------------------------------------------------------- No results for input(s): DDIMER in the last 72 hours. -------------------------------------------------------------------------------------------------------------------  Cardiac Enzymes Recent  Labs  Lab 11/22/18 1910  TROPONINI <0.03   ------------------------------------------------------------------------------------------------------------------ Invalid input(s): POCBNP  ---------------------------------------------------------------------------------------------------------------  Urinalysis    Component Value Date/Time   COLORURINE YELLOW (A) 11/22/2018 1910   APPEARANCEUR CLEAR (A) 11/22/2018 1910   APPEARANCEUR Clear 10/30/2017 1547   LABSPEC 1.004 (L) 11/22/2018 1910   LABSPEC 1.010 02/20/2015 1657   PHURINE  7.0 11/22/2018 1910   GLUCOSEU NEGATIVE 11/22/2018 1910   GLUCOSEU Negative 02/20/2015 1657   HGBUR NEGATIVE 11/22/2018 1910   BILIRUBINUR NEGATIVE 11/22/2018 1910   BILIRUBINUR Negative 10/30/2017 1547   BILIRUBINUR Negative 02/20/2015 1657   KETONESUR NEGATIVE 11/22/2018 1910   PROTEINUR NEGATIVE 11/22/2018 1910   NITRITE NEGATIVE 11/22/2018 1910   LEUKOCYTESUR NEGATIVE 11/22/2018 1910   LEUKOCYTESUR Negative 10/30/2017 1547   LEUKOCYTESUR 2+ 02/20/2015 1657     RADIOLOGY: Dg Chest 1 View  Result Date: 11/22/2018 CLINICAL DATA:  Patient is lethargic. EXAM: CHEST  1 VIEW COMPARISON:  April 02, 2009 FINDINGS: The heart size and mediastinal contours are within normal limits. Both lungs are clear. The visualized skeletal structures are unremarkable. IMPRESSION: No active disease. Electronically Signed   By: Dorise Bullion III M.D   On: 11/22/2018 20:25   Ct Head Wo Contrast  Result Date: 11/22/2018 CLINICAL DATA:  78 year old female with altered mental status. EXAM: CT HEAD WITHOUT CONTRAST TECHNIQUE: Contiguous axial images were obtained from the base of the skull through the vertex without intravenous contrast. COMPARISON:  08/17/2006 CT FINDINGS: Brain: No evidence of acute infarction, hemorrhage, hydrocephalus, extra-axial collection or mass lesion/mass effect. Mild chronic small-vessel white matter ischemic changes, small area of LEFT frontoparietal encephalomalacia and heavily calcified meningiomas along the LATERAL LEFT frontotemporal region again noted. Vascular: Mild carotid atherosclerotic calcifications noted. Skull: No acute abnormality. LEFT parietal craniotomy changes again noted. Sinuses/Orbits: No acute abnormality Other: None IMPRESSION: 1. No evidence of acute intracranial abnormality 2. Mild chronic small-vessel white matter ischemic changes. Electronically Signed   By: Margarette Canada M.D.   On: 11/22/2018 20:56    EKG: Orders placed or performed during the hospital encounter  of 11/22/18  . EKG 12-Lead  . EKG 12-Lead    IMPRESSION AND PLAN:  *Altered mental status Infection or head injury ruled out.  TSH normal. Alcohol and salicylate levels are not high. She is not on opioid or banzo/ barbiturates at home. Later during my visit she was alert and baseline. This could be due to a seizure episode which was unwitnessed. Check Dilantin level. Neurology consult in the morning.   All the records are reviewed and case discussed with ED provider. Management plans discussed with the patient, family and they are in agreement.  CODE STATUS: Partial    Code Status Orders  (From admission, onward)         Start     Ordered   11/23/18 0045  Limited resuscitation (code)  Continuous    Question Answer Comment  In the event of cardiac or respiratory ARREST: Initiate Code Blue, Call Rapid Response Yes   In the event of cardiac or respiratory ARREST: Perform CPR Yes   In the event of cardiac or respiratory ARREST: Perform Intubation/Mechanical Ventilation No   In the event of cardiac or respiratory ARREST: Use NIPPV/BiPAp only if indicated Yes   In the event of cardiac or respiratory ARREST: Administer ACLS medications if indicated Yes   In the event of cardiac or respiratory ARREST: Perform Defibrillation or Cardioversion if indicated Yes      11/23/18 0044  Code Status History    Date Active Date Inactive Code Status Order ID Comments User Context   02/21/2015 2345 02/22/2015 1906 Full Code [id number]  Dia Sitter, RN Inpatient      Discussed with patient's daughter was present in the room during my visit. TOTAL TIME TAKING CARE OF THIS PATIENT: *45 minutes.    Vaughan Basta M.D on 11/23/2018   Between 7am to 6pm - Pager - [phone]  After 6pm go to [email or web address] - password [email or web address] [facility]  Office  [phone]  CC: Primary care physician; Baxter Hire, MD   Note: This dictation was prepared with  Dragon dictation along with smaller phrase technology. Any transcriptional errors that result from this process are unintentional.

## 2018-11-24 MED ORDER — LAMOTRIGINE 25 MG PO TABS: 25.0000 mg | ORAL_TABLET | Freq: Two times a day (BID) | ORAL | 0 refills | Status: DC

## 2018-11-24 NOTE — Care Management Note (Signed)
Case Management Note  Patient Details  Name: [name] MRN: [id number] Date of Birth: 04/20/1941  Subjective/Objective:  Patient to be discharged per MD order. Orders in place for home health services. Patient agreeable to home health. Patient and daughter have no preference of agency. Referral placed with [name] of [facility] for services. No DME needs.                  Action/Plan:   Expected Discharge Date:  11/24/18               Expected Discharge Plan:  [place]  In-House Referral:     Discharge planning Services  CM Consult  Post Acute Care Choice:  Home Health Choice offered to:  Patient  DME Arranged:    DME Agency:     HH Arranged:  RN, PT, Nurse's Aide [facility] Agency:  [place]  Status of Service:  Completed, signed off  If discussed at [place] of Stay Meetings, dates discussed:    Additional Comments:  Latanya Maudlin, RN 11/24/2018, 2:54 PM

## 2018-11-24 NOTE — Progress Notes (Signed)
Pt and family now agreeable to [facility] PT and services she would qualify for. MD notified with case mgt referral submitted. Pt discharged home with family/self care with PT referral. Transported in transport chair to private vehicle

## 2018-11-24 NOTE — Discharge Instructions (Signed)
It was so nice to meet you this hospitalization!  We think you were having seizures at home. Please keep taking the Dilantin as you currently are. The neurologist started a new medicine called Lamictal. Please take 25mg  twice a day.  Please make sure you follow-up with your primary care doctor in the next 5 days.  Take care, Dr. Brett Albino

## 2018-11-24 NOTE — Discharge Summary (Signed)
[name] at [place] NAME: [name]    MR#:  [id number]  DATE OF BIRTH:  11/21/1940  DATE OF ADMISSION:  11/22/2018   ADMITTING PHYSICIAN: Vaughan Basta, MD  DATE OF DISCHARGE: 11/24/18  PRIMARY CARE PHYSICIAN: Baxter Hire, MD   ADMISSION DIAGNOSIS:  Altered mental status, unspecified altered mental status type [R41.82] DISCHARGE DIAGNOSIS:  Principal Problem:   Altered mental status  SECONDARY DIAGNOSIS:   Past Medical History:  Diagnosis Date  . Basal cell carcinoma of skin of face 02/2011  . Brain tumor ([place]) 2006  . Breast cancer ([place]) 2010   right mastectomy  . 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 H/O mammogram 02/02/2015   within normal limits.  . History of chemotherapy    Received 5-6 cycles adjuvant chemotherapy with Taxotere, Carboplatin, Herceptin ( last chemotherapy was on 07/21/09, patient discontinued Herceptin in December 2010).  . Osteoporosis 09/2012   seen on DEXA scan 2013  . Seizure disorder [facility])    HOSPITAL COURSE:   [name] is a 78 year old female who presented to the ED with altered mental status and drowsiness.  In the ED, CT head was negative.  She was admitted for further management.  1.  Acute encephalopathy- felt to be secondary to seizures -AMS completely resolved on the day of discharge -CT head negative -MRI brain with no acute changes, but evidence of chronic microhemorrhages  2.  Seizures- no seizure-like activity during this hospitalization -Seen by neurology, who started patient on Lamictal 25 mg twice daily in addition to current Dilantin dose -Dilantin level was therapeutic -Patient should follow-up with neurology as an outpatient  3.  GERD- continued PPI  4  History of breast cancer- follows up annually with oncology (Dr. Rogue Bussing)  5.    Generalized weakness -PT recommended home health PT, but  patient declined  6.  Osteoporosis -Continue alendronate  DISCHARGE CONDITIONS:  Seizures GERD History of breast cancer Generalized weakness CONSULTS OBTAINED:  Treatment Team:  Alexis Goodell, MD DRUG ALLERGIES:   Allergies  Allergen Reactions  . Other Diarrhea    Food Allergy to Bananas- cause diarrhea Band Aids cause Rash   . Vicodin [Hydrocodone-Acetaminophen] Other (See Comments)    GI Distress   DISCHARGE MEDICATIONS:   Allergies as of 11/24/2018      Reactions   Other Diarrhea   Food Allergy to Bananas- cause diarrhea Band Aids cause Rash   Vicodin [hydrocodone-acetaminophen] Other (See Comments)   GI Distress      Medication List    STOP taking these medications   cephALEXin 500 MG capsule Commonly known as:  KEFLEX     TAKE these medications   alendronate 70 MG tablet Commonly known as:  FOSAMAX Take 1 tablet by mouth once a week.   Calcium Carbonate-Vitamin D 600-400 MG-UNIT tablet Take 1 tablet by mouth daily. 1   lamoTRIgine 25 MG tablet Commonly known as:  LAMICTAL Take 1 tablet (25 mg total) by mouth 2 (two) times daily.   naproxen 500 MG tablet Commonly known as:  NAPROSYN Take 1 tablet by mouth 3 (three) times daily with meals.   omeprazole 20 MG capsule Commonly known as:  PRILOSEC Take 20 mg by mouth daily.   ONE-A-DAY WOMENS FORMULA PO Take 1 tablet by mouth daily.   phenytoin 100 MG ER capsule Commonly known as:  DILANTIN Take 100 mg by  mouth 3 (three) times daily.   VITAMIN B-12 PO Take by mouth daily.        DISCHARGE INSTRUCTIONS:  1.  Follow-up with PCP in 5 days 2.  Follow up with neurology in 1 to 2 weeks 3.  Start Lamictal 25 mg twice daily per neuro recommendations 4.  Continue Dilantin at current dose DIET:  Cardiac diet DISCHARGE CONDITION:  Stable ACTIVITY:  Activity as tolerated OXYGEN:  Home Oxygen: No.  Oxygen Delivery: room air DISCHARGE LOCATION:  home   If you experience worsening of your  admission symptoms, develop shortness of breath, life threatening emergency, suicidal or homicidal thoughts you must seek medical attention immediately by calling 911 or calling your MD immediately  if symptoms less severe.  You Must read complete instructions/literature along with all the possible adverse reactions/side effects for all the Medicines you take and that have been prescribed to you. Take any new Medicines after you have completely understood and accpet all the possible adverse reactions/side effects.   Please note  You were cared for by a hospitalist during your hospital stay. If you have any questions about your discharge medications or the care you received while you were in the hospital after you are discharged, you can call the unit and asked to speak with the hospitalist on call if the hospitalist that took care of you is not available. Once you are discharged, your primary care physician will handle any further medical issues. Please note that NO REFILLS for any discharge medications will be authorized once you are discharged, as it is imperative that you return to your primary care physician (or establish a relationship with a primary care physician if you do not have one) for your aftercare needs so that they can reassess your need for medications and monitor your lab values.    On the day of Discharge:  VITAL SIGNS:  Blood pressure (!) 144/67, pulse 64, temperature 97.8 F (36.6 C), temperature source Oral, resp. rate 20, height '5\' 3"'  (1.6 m), weight 48.7 kg, SpO2 93 %. PHYSICAL EXAMINATION:  GENERAL:  78 y.o.-year-old patient lying in the bed with no acute distress.  EYES: Pupils equal, round, reactive to light and accommodation. No scleral icterus. Extraocular muscles intact.  HEENT: Head atraumatic, normocephalic. Oropharynx and nasopharynx clear.  NECK:  Supple, no jugular venous distention. No thyroid enlargement, no tenderness.  LUNGS: Normal breath sounds bilaterally,  no wheezing, rales,rhonchi or crepitation. No use of accessory muscles of respiration.  CARDIOVASCULAR: RRR, S1, S2 normal. No murmurs, rubs, or gallops.  ABDOMEN: Soft, non-tender, non-distended. Bowel sounds present. No organomegaly or mass.  EXTREMITIES: No pedal edema, cyanosis, or clubbing.  NEUROLOGIC: Cranial nerves II through XII are intact. +global weakness. Sensation intact. Gait not checked.  PSYCHIATRIC: The patient is alert and oriented x 3.  SKIN: No obvious rash, lesion, or ulcer.  DATA REVIEW:   CBC Recent Labs  Lab 11/23/18 0401  WBC 6.4  HGB 14.3  HCT 42.5  PLT 235    Chemistries  Recent Labs  Lab 11/22/18 1910 11/23/18 0401  NA 137 140  K 3.9 3.5  CL 102 105  CO2 27 28  GLUCOSE 104* 89  BUN 12 12  CREATININE 0.98 0.94  CALCIUM 9.3 9.0  AST 29  --   ALT 23  --   ALKPHOS 67  --   BILITOT 0.7  --      Microbiology Results  Results for orders placed or performed during the hospital  encounter of 06/10/18  Urine Culture     Status: Abnormal   Collection Time: 06/10/18  8:53 PM  Result Value Ref Range Status   Specimen Description   Final    URINE, CATHETERIZED Performed at [facility], [address] [place], [place] [address]    Special Requests   Final    NONE Performed at [facility], [address] [place], [place] [address]    Culture (A)  Final    <10,000 COLONIES/mL INSIGNIFICANT GROWTH Performed at [facility] [address], [place], [place] [address]    Report Status 06/12/2018 FINAL  Final    RADIOLOGY:  No results found.   Management plans discussed with the patient, family and they are in agreement.  CODE STATUS: Partial Code   TOTAL TIME TAKING CARE OF THIS PATIENT: 35 minutes.    Berna Spare  M.D on 11/24/2018 at 11:29 AM  Between 7am to 6pm - Pager - [phone]  After 6pm go to [email or web address] - Proofreader  [employer] [facility]  Office  [phone]  CC:  Primary care physician; Baxter Hire, MD   Note: This dictation was prepared with Dragon dictation along with smaller phrase technology. Any transcriptional errors that result from this process are unintentional.

## 2018-11-24 NOTE — Progress Notes (Signed)
Discussed with dgt recommendation for pill pack use or medicine supervision and states she understands. RX given for lamictal placed in discharge packet with AVS with oral and written reviewed with pt and dgt. Plans to discharge home to self/family care.

## 2018-11-26 LAB — PHENYTOIN LEVEL, FREE AND TOTAL
Phenytoin, Free: 1.7 ug/mL (ref 1.0–2.0)
Phenytoin, Total: 22 ug/mL — ABNORMAL HIGH (ref 10.0–20.0)

## 2018-12-05 ENCOUNTER — Inpatient Hospital Stay: Payer: Medicare Other

## 2018-12-05 ENCOUNTER — Inpatient Hospital Stay: Payer: Medicare Other | Admitting: Internal Medicine

## 2018-12-19 ENCOUNTER — Other Ambulatory Visit: Payer: Self-pay | Admitting: Internal Medicine

## 2018-12-20 ENCOUNTER — Other Ambulatory Visit: Payer: Self-pay | Admitting: Internal Medicine

## 2019-10-25 IMAGING — CT CT HEAD W/O CM
4 series · 16 of 47 positions shown, 18 images · non-contrast
Comparison: 08/17/2006 CT

CLINICAL DATA: 77-year-old female with altered mental status.

EXAM:
CT HEAD WITHOUT CONTRAST
TECHNIQUE: Contiguous axial images were obtained from the base of the skull
through the vertex without intravenous contrast.

[Series 2: head bone · axial · 0.40mm/px · z∈[+54,+82]mm · 3 of 73 slices shown]
[im 8/73  bone]
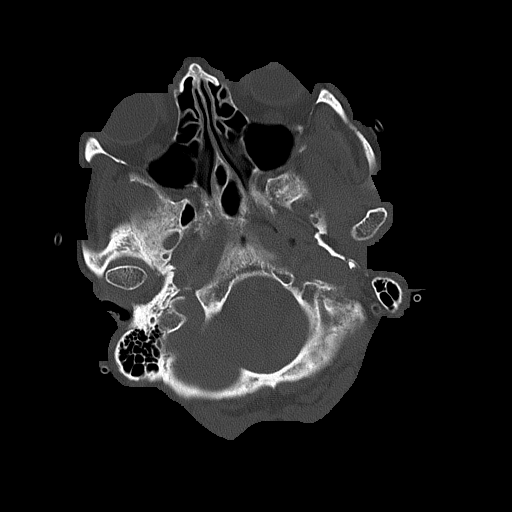
[im 15/73  bone]
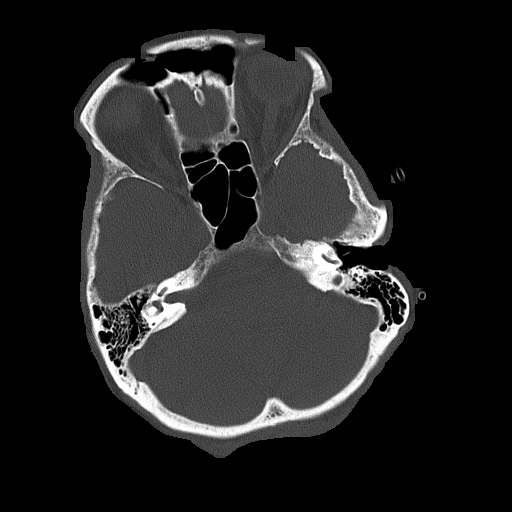
[im 22/73  bone]
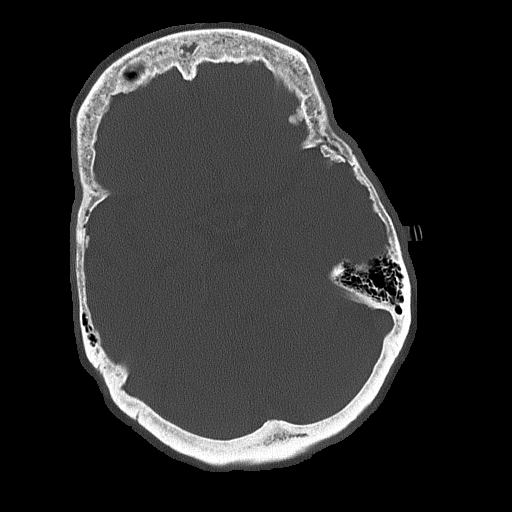

[Series 3: head wo · axial · 0.40mm/px · z∈[+56,+166]mm · 7 of 30 slices shown, 9 images]
[im 4/30  brain]
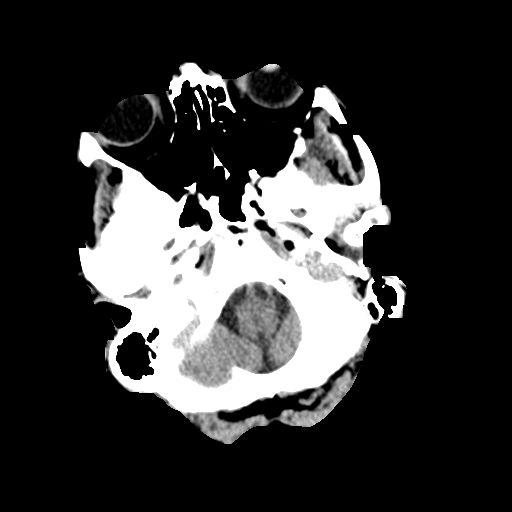
[im 4/30  bone]
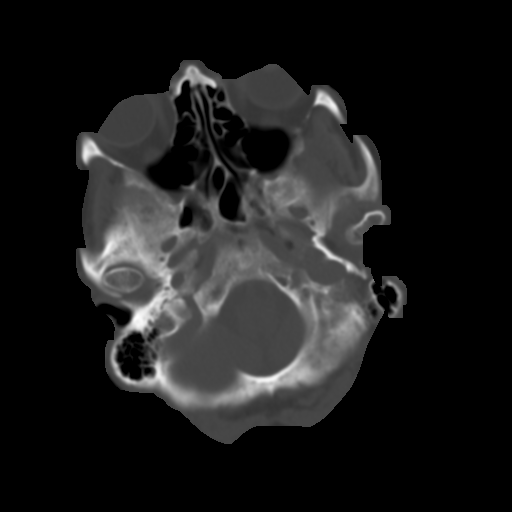
[im 8/30  brain]
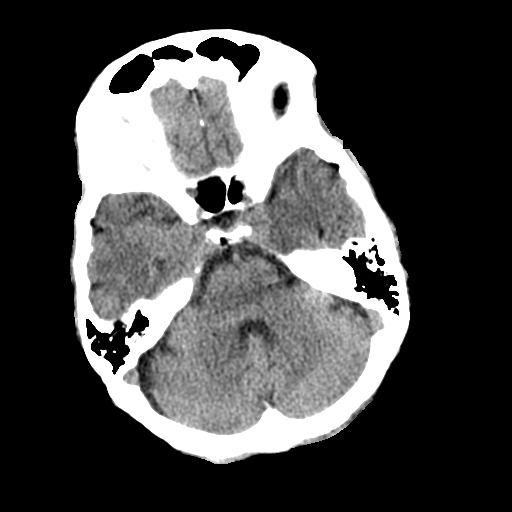
[im 11/30  brain]
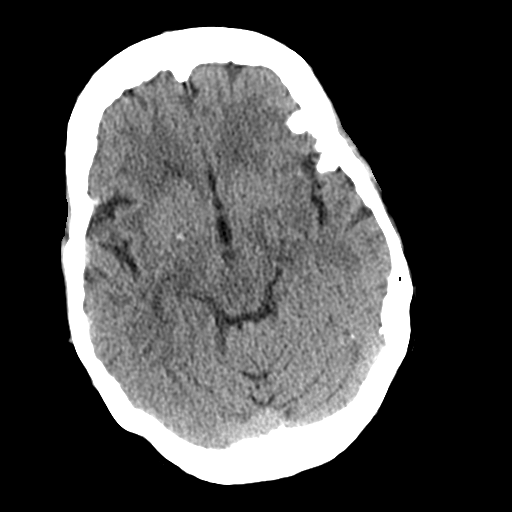
[im 15/30  brain]
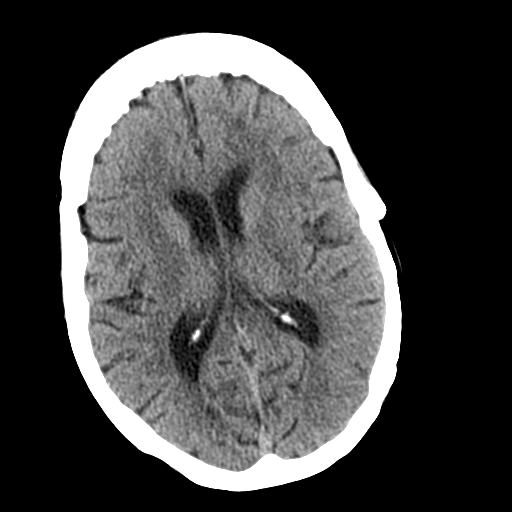
[im 19/30  brain]
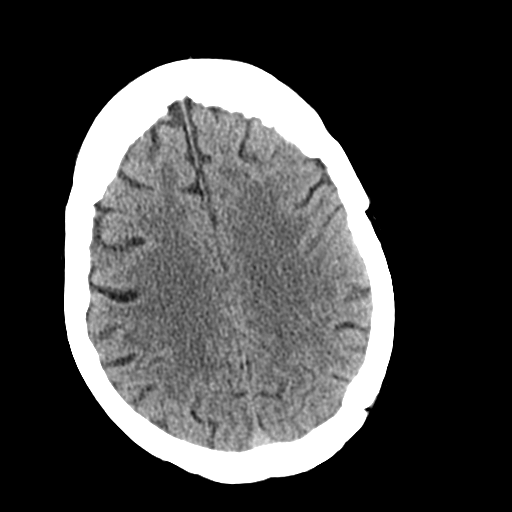
[im 19/30  bone]
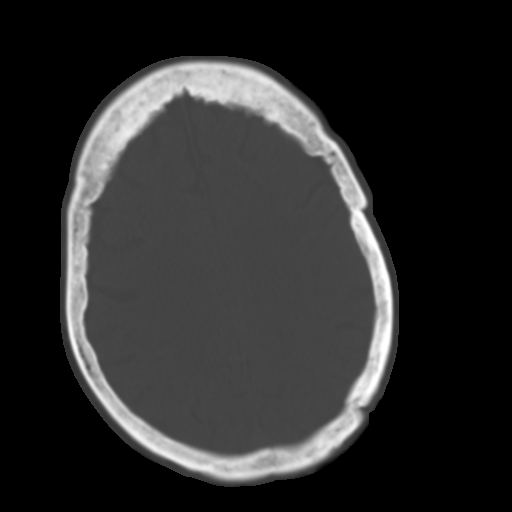
[im 22/30  brain]
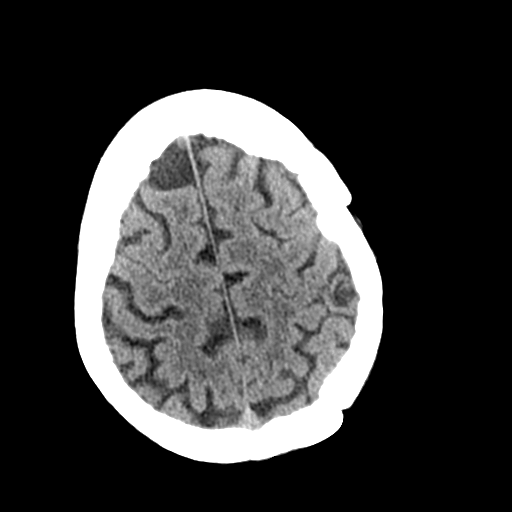
[im 26/30  brain]
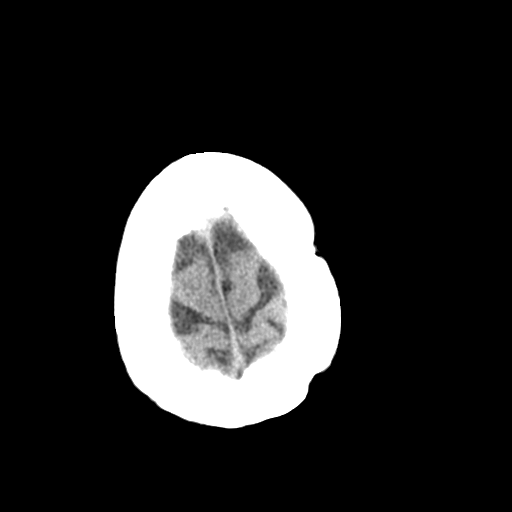

[Series 4: coronal soft tissue · coronal · 0.31mm/px · 3 of 62 slices shown]
[im 21/62  brain]
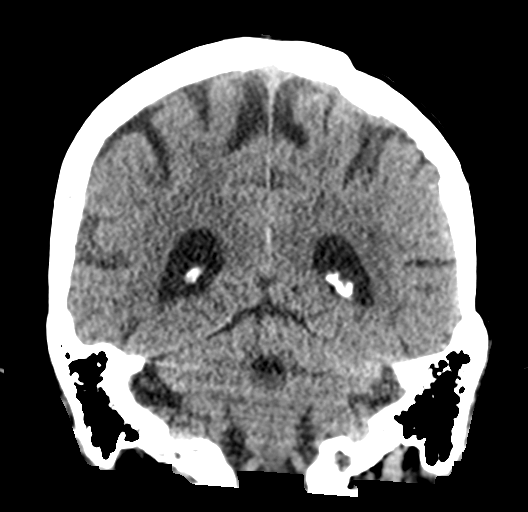
[im 28/62  brain]
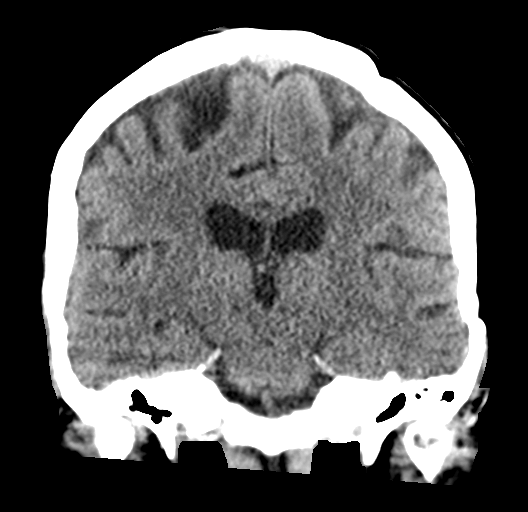
[im 34/62  brain]
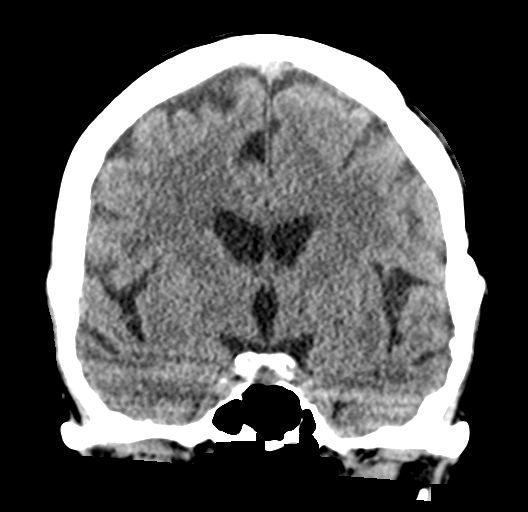

[Series 5: sagittal soft tissue · sagittal · 0.30mm/px · 3 of 48 slices shown]
[im 16/48  brain]
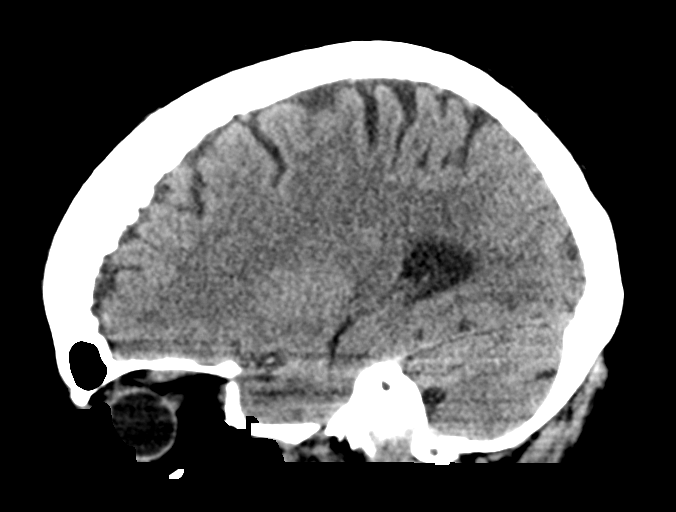
[im 24/48  brain]
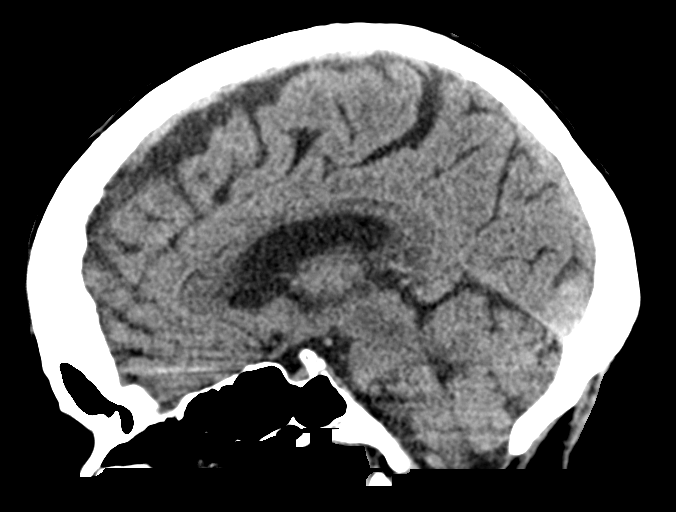
[im 32/48  brain]
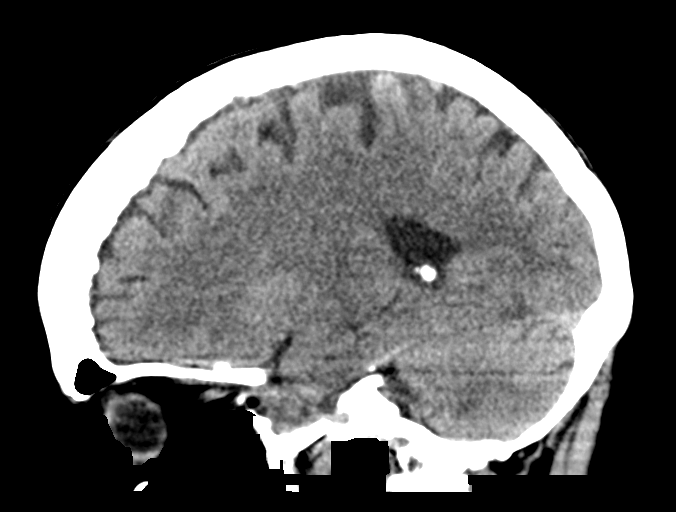

[16 of 47 positions shown; findings below may reference images not displayed]

FINDINGS: Brain: No evidence of acute infarction, hemorrhage, hydrocephalus,
extra-axial collection or mass lesion/mass effect.

Mild chronic small-vessel white matter ischemic changes, small area
of LEFT frontoparietal encephalomalacia and heavily calcified
meningiomas along the LATERAL LEFT frontotemporal region again
noted.

Vascular: Mild carotid atherosclerotic calcifications noted.

Skull: No acute abnormality. LEFT parietal craniotomy changes again
noted.

Sinuses/Orbits: No acute abnormality

Other: None
IMPRESSION: 1. No evidence of acute intracranial abnormality
2. Mild chronic small-vessel white matter ischemic changes.

## 2020-02-20 ENCOUNTER — Other Ambulatory Visit
Admission: RE | Admit: 2020-02-20 | Discharge: 2020-02-20 | Disposition: A | Discharge status: Home / Self Care | Payer: Medicare Other | Source: Ambulatory Visit | Attending: Internal Medicine | Admitting: Internal Medicine

## 2020-02-20 DIAGNOSIS — G40309 Generalized idiopathic epilepsy and epileptic syndromes, not intractable, without status epilepticus: Secondary | ICD-10-CM | POA: Insufficient documentation

## 2020-02-20 LAB — PHENYTOIN LEVEL, TOTAL: Phenytoin Lvl: 22.3 ug/mL — ABNORMAL HIGH (ref 10.0–20.0)

## 2024-09-23 DEATH — deceased
